# Patient Record
Sex: Female | Born: 1985 | Hispanic: Yes | Marital: Single | Smoking: Never smoker
Health system: Southern US, Community
[De-identification: ages and names within clinical notes are randomized; demographics above are authoritative.]

## PROBLEM LIST (undated history)

## (undated) ENCOUNTER — Inpatient Hospital Stay (HOSPITAL_COMMUNITY): Payer: Self-pay

## (undated) ENCOUNTER — Inpatient Hospital Stay (HOSPITAL_COMMUNITY): Payer: Medicaid Other

## (undated) DIAGNOSIS — D649 Anemia, unspecified: Secondary | ICD-10-CM

## (undated) DIAGNOSIS — R519 Headache, unspecified: Secondary | ICD-10-CM

## (undated) DIAGNOSIS — R51 Headache: Secondary | ICD-10-CM

## (undated) HISTORY — DX: Anemia, unspecified: D64.9

## (undated) HISTORY — PX: APPENDECTOMY: SHX54

---

## 2013-08-26 ENCOUNTER — Encounter: Payer: Self-pay | Admitting: Obstetrics & Gynecology

## 2013-08-26 ENCOUNTER — Ambulatory Visit (INDEPENDENT_AMBULATORY_CARE_PROVIDER_SITE_OTHER): Payer: Self-pay | Admitting: Obstetrics & Gynecology

## 2013-08-26 VITALS — BP 103/68 | HR 70 | Temp 98.3°F | Ht 62.0 in | Wt 122.0 lb

## 2013-08-26 DIAGNOSIS — B373 Candidiasis of vulva and vagina: Secondary | ICD-10-CM

## 2013-08-26 DIAGNOSIS — A499 Bacterial infection, unspecified: Secondary | ICD-10-CM

## 2013-08-26 DIAGNOSIS — B9689 Other specified bacterial agents as the cause of diseases classified elsewhere: Secondary | ICD-10-CM

## 2013-08-26 DIAGNOSIS — B3731 Acute candidiasis of vulva and vagina: Secondary | ICD-10-CM

## 2013-08-26 DIAGNOSIS — N760 Acute vaginitis: Secondary | ICD-10-CM

## 2013-08-26 MED ORDER — FLUCONAZOLE 150 MG PO TABS: 150.0000 mg | ORAL_TABLET | ORAL | Status: DC

## 2013-08-26 MED ORDER — METRONIDAZOLE 500 MG PO TABS: 500.0000 mg | ORAL_TABLET | Freq: Two times a day (BID) | ORAL | Status: DC

## 2013-08-26 NOTE — Progress Notes (Signed)
Subjective:     Kaylee LaddDebbie Kelly is a 28 y.o. female here for a routine exam.  Current complaints: Patient in office today for a problem visit. Patient states that she thinks she has a possible infection. Patient states she has irritation, burning anddryness. Patient states she has a cottage cheese like discharge. Patient states she used nair and when she applied the nair it started burning immediatly and she removed it right away but that it continued to burn for the next two days. Patient states that on the inside she has spots where the skin has peeled. Patient states she did not put any inside the vagina just around the outside but all of the burning is on the inside. Patient denies any itching or odor. Patient states she has a Nexplanon and that it is due to be removed next month. Personal health questionnaire reviewed: yes.   Gynecologic History Patient's last menstrual period was 08/10/2013. Contraception: Nexplanon Last Pap: 2014. Results were: normal  Obstetric History OB History  Gravida Para Term Preterm AB SAB TAB Ectopic Multiple Living  1 1 1       1     # Outcome Date GA Lbr Len/2nd Weight Sex Delivery Anes PTL Lv  1 TRM 04/16/10 [id number]  7 lb (3.175 kg) F LTCS EPI  Y       The following portions of the patient's history were reviewed and updated as appropriate: allergies, current medications, past family history, past medical history, past social history, past surgical history and problem list.  Review of Systems Pertinent items are noted in HPI.    Objective:     Pelvic: EFG w/erythema, white discharge SPEC: thin white discharge     Assessment:   Mixed vulvovaginitis--bacterial vaginosis, candida Atopic dermatitis  Plan:  Avoid irritants Warm soaks Vaseline Orders Placed This Encounter  Procedures  . POCT Wet Prep [facility] [place])  Metronidazole/Diflucan Return prn

## 2013-08-27 ENCOUNTER — Encounter: Payer: Self-pay | Admitting: Obstetrics & Gynecology

## 2013-08-27 LAB — POCT WET PREP (WET MOUNT): CLUE CELLS WET PREP WHIFF POC: POSITIVE

## 2013-08-27 NOTE — Patient Instructions (Signed)
Vaginitis Vaginitis is an inflammation of the vagina. It is most often caused by a change in the normal balance of the bacteria and yeast that live in the vagina. This change in balance causes an overgrowth of certain bacteria or yeast, which causes the inflammation. There are different types of vaginitis, but the most common types are:  Bacterial vaginosis.  Yeast infection (candidiasis).  Trichomoniasis vaginitis. This is a sexually transmitted infection (STI).  Viral vaginitis.  Atropic vaginitis.  Allergic vaginitis. CAUSES  The cause depends on the type of vaginitis. Vaginitis can be caused by:  Bacteria (bacterial vaginosis).  Yeast (yeast infection).  A parasite (trichomoniasis vaginitis)  A virus (viral vaginitis).  Low hormone levels (atrophic vaginitis). Low hormone levels can occur during pregnancy, breastfeeding, or after menopause.  Irritants, such as bubble baths, scented tampons, and feminine sprays (allergic vaginitis). Other factors can change the normal balance of the yeast and bacteria that live in the vagina. These include:  Antibiotic medicines.  Poor hygiene.  Diaphragms, vaginal sponges, spermicides, birth control pills, and intrauterine devices (IUD).  Sexual intercourse.  Infection.  Uncontrolled diabetes.  A weakened immune system. SYMPTOMS  Symptoms can vary depending on the cause of the vaginitis. Common symptoms include:  Abnormal vaginal discharge.  The discharge is white, gray, or yellow with bacterial vaginosis.  The discharge is thick, white, and cheesy with a yeast infection.  The discharge is frothy and yellow or greenish with trichomoniasis.  A bad vaginal odor.  The odor is fishy with bacterial vaginosis.  Vaginal itching, pain, or swelling.  Painful intercourse.  Pain or burning when urinating. Sometimes, there are no symptoms. TREATMENT  Treatment will vary depending on the type of infection.   Bacterial  vaginosis and trichomoniasis are often treated with antibiotic creams or pills.  Yeast infections are often treated with antifungal medicines, such as vaginal creams or suppositories.  Viral vaginitis has no cure, but symptoms can be treated with medicines that relieve discomfort. Your sexual partner should be treated as well.  Atrophic vaginitis may be treated with an estrogen cream, pill, suppository, or vaginal ring. If vaginal dryness occurs, lubricants and moisturizing creams may help. You may be told to avoid scented soaps, sprays, or douches.  Allergic vaginitis treatment involves quitting the use of the product that is causing the problem. Vaginal creams can be used to treat the symptoms. HOME CARE INSTRUCTIONS   Take all medicines as directed by your caregiver.  Keep your genital area clean and dry. Avoid soap and only rinse the area with water.  Avoid douching. It can remove the healthy bacteria in the vagina.  Do not use tampons or have sexual intercourse until your vaginitis has been treated. Use sanitary pads while you have vaginitis.  Wipe from front to back. This avoids the spread of bacteria from the rectum to the vagina.  Let air reach your genital area.  Wear cotton underwear to decrease moisture buildup.  Avoid wearing underwear while you sleep until your vaginitis is gone.  Avoid tight pants and underwear or nylons without a cotton panel.  Take off wet clothing (especially bathing suits) as soon as possible.  Use mild, non-scented products. Avoid using irritants, such as:  Scented feminine sprays.  Fabric softeners.  Scented detergents.  Scented tampons.  Scented soaps or bubble baths.  Practice safe sex and use condoms. Condoms may prevent the spread of trichomoniasis and viral vaginitis. SEEK MEDICAL CARE IF:   You have abdominal pain.  You   have a fever or persistent symptoms for more than 2 3 days.  You have a fever and your symptoms suddenly  get worse. Document Released: 05/04/2007 Document Revised: 03/31/2012 Document Reviewed: 12/18/2011 [employer] Patient Information 2014 [employer].  

## 2014-05-22 ENCOUNTER — Encounter: Payer: Self-pay | Admitting: Obstetrics & Gynecology

## 2014-07-17 ENCOUNTER — Encounter: Payer: Self-pay | Admitting: *Deleted

## 2014-07-18 ENCOUNTER — Encounter: Payer: Self-pay | Admitting: Obstetrics & Gynecology

## 2014-07-21 NOTE — L&D Delivery Note (Signed)
Called by Dr. Leotis ShamesAkintemi to attend this [redacted] week gestation delivery by repeat C-Section for persistent breech presentation after two failed version attempts. Infant was born to a  29 yo, Gr 2 woman.  Infant was vigorous at birth with a hardy cry and good tone.  Apgars were 9 and 9 at 1 and 5 minutes respectively.  Three vessel cord noted.  No obvious abnormalities noted.

## 2014-07-31 ENCOUNTER — Inpatient Hospital Stay (HOSPITAL_COMMUNITY)
Admission: AD | Admit: 2014-07-31 | Discharge: 2014-07-31 | Disposition: A | Discharge status: Home / Self Care | Payer: Medicaid Other | Source: Ambulatory Visit | Attending: Family Medicine | Admitting: Family Medicine

## 2014-07-31 ENCOUNTER — Encounter (HOSPITAL_COMMUNITY): Payer: Self-pay

## 2014-07-31 DIAGNOSIS — B373 Candidiasis of vulva and vagina: Secondary | ICD-10-CM | POA: Diagnosis not present

## 2014-07-31 DIAGNOSIS — O98819 Other maternal infectious and parasitic diseases complicating pregnancy, unspecified trimester: Secondary | ICD-10-CM | POA: Insufficient documentation

## 2014-07-31 DIAGNOSIS — R350 Frequency of micturition: Secondary | ICD-10-CM

## 2014-07-31 DIAGNOSIS — B3731 Acute candidiasis of vulva and vagina: Secondary | ICD-10-CM

## 2014-07-31 DIAGNOSIS — Z3A00 Weeks of gestation of pregnancy not specified: Secondary | ICD-10-CM | POA: Insufficient documentation

## 2014-07-31 DIAGNOSIS — R51 Headache: Secondary | ICD-10-CM | POA: Insufficient documentation

## 2014-07-31 DIAGNOSIS — R109 Unspecified abdominal pain: Secondary | ICD-10-CM | POA: Diagnosis present

## 2014-07-31 LAB — URINALYSIS, ROUTINE W REFLEX MICROSCOPIC
Bilirubin Urine: NEGATIVE
Glucose, UA: NEGATIVE mg/dL
Hgb urine dipstick: NEGATIVE
Ketones, ur: NEGATIVE mg/dL
NITRITE: NEGATIVE
Protein, ur: NEGATIVE mg/dL
Specific Gravity, Urine: 1.02 (ref 1.005–1.030)
Urobilinogen, UA: 0.2 mg/dL (ref 0.0–1.0)
pH: 7.5 (ref 5.0–8.0)

## 2014-07-31 LAB — URINE MICROSCOPIC-ADD ON

## 2014-07-31 LAB — WET PREP, GENITAL
Clue Cells Wet Prep HPF POC: NONE SEEN
Trich, Wet Prep: NONE SEEN

## 2014-07-31 MED ORDER — BUTALBITAL-APAP-CAFFEINE 50-325-40 MG PO TABS
2.0000 | ORAL_TABLET | Freq: Once | ORAL | Status: AC
  Administered 2014-07-31: 2 via ORAL
  Filled 2014-07-31: qty 2

## 2014-07-31 MED ORDER — PRENATAL VITAMINS 0.8 MG PO TABS: 1.0000 | ORAL_TABLET | Freq: Every day | ORAL | Status: DC

## 2014-07-31 MED ORDER — FLUCONAZOLE 150 MG PO TABS: 150.0000 mg | ORAL_TABLET | Freq: Once | ORAL | Status: DC

## 2014-07-31 NOTE — MAU Provider Note (Signed)
History   CSN: [id number]  Arrival date and time: 07/31/14 1156   First Provider Initiated Contact with Patient 07/31/14 1356      Chief Complaint  Patient presents with  . Abdominal Pain  . Urinary Tract Infection   The history is provided by the patient.    Kaylee Kelly is a 29 y.o. female G2P1001 presenting to MAU complaining of abdominal pain and daily headaches. Kaylee Kelly has some difficulty providing her history, but estimates that she has been having headaches for 3 weeks, rating pain at 9/10 and states they've been waking her up from sleep. Vaguely localizes the headache to the back of the head but is unable to further characterize.  3 weeks ago she developed sharp, stabbing suprapubic pain post voiding, with urinary frequency, urgency and 2 episodes of  incontinence. Also endorses mod low back pain. Denies any fever, chills, changes in vision, malaise, chest pain, diarrhea or constipation. Tried ibuprofen for HE last week without much relief. Had Nexplanon removed 6 months ago and has not had menses since. 4 days ago confirmed pregnancy with at home UPT. Unsure the dating of pregnancy. Previous pregnancy was uneventful.    OB History    Gravida Para Term Preterm AB TAB SAB Ectopic Multiple Living   Past Medical History  Diagnosis Date  . Anemia     Past Surgical History  Procedure Laterality Date  . Cesarean section    . Appendectomy      Family History  Problem Relation Age of Onset  . Hyperlipidemia Father     History  Substance Use Topics  . Smoking status: Never Smoker   . Smokeless tobacco: Never Used  . Alcohol Use: No     Comment: socially     Allergies: No Known Allergies  Prescriptions prior to admission  Medication Sig Dispense Refill Last Dose  . ibuprofen (ADVIL,MOTRIN) 200 MG tablet Take 200 mg by mouth daily as needed for mild pain or moderate pain.   Past Week at Unknown time  . fluconazole (DIFLUCAN) 150 MG tablet  Take 1 tablet (150 mg total) by mouth every other day. For one week (Patient not taking: Reported on 07/31/2014) 3 tablet 1   . metroNIDAZOLE (FLAGYL) 500 MG tablet Take 1 tablet (500 mg total) by mouth 2 (two) times daily. (Patient not taking: Reported on 07/31/2014) 14 tablet 0     Review of Systems  Constitutional: Negative for fever, chills and malaise/fatigue.  Eyes: Negative for blurred vision and double vision.  Cardiovascular: Negative for chest pain.  Gastrointestinal: Positive for nausea (controlled with lavender scent) and abdominal pain (suprapubic). Negative for heartburn, vomiting, diarrhea, constipation and blood in stool.  Genitourinary: Positive for dysuria (sharp stabbing pain post voiding), urgency and frequency. Negative for hematuria and flank pain.       2 episodes of urinary incontinence Denies vaginal bleeding.  Endorses white vaginal discharge  Neurological: Positive for headaches (daily, wakes pt from sleep at night, 9/10).   Physical Exam   Blood pressure 105/59, pulse 70, temperature 97.8 F (36.6 C), temperature source Oral, resp. rate 16, height 5' 4.4" (1.636 Kelly), weight 123 lb (55.792 kg).  Physical Exam  Vitals reviewed. Constitutional: She is oriented to person, place, and time. She appears well-developed and well-nourished. No distress.  HENT:  Head: Normocephalic and atraumatic.  Neck: Normal range of motion.  Cardiovascular: Normal rate and intact  distal pulses.   No peripheral edema.   Respiratory: Effort normal.  GI: Soft. Bowel sounds are normal. She exhibits no distension and no mass. There is tenderness (LLQ and suprapubic ). There is guarding. There is no rebound and no CVA tenderness.  Genitourinary: Pelvic exam was performed with patient supine. There is no rash, tenderness, lesion or injury on the right labia. There is no rash, tenderness, lesion or injury on the left labia. Uterus is enlarged (fundus est. 2 cm below umbilicus). Uterus is not  tender. Cervix exhibits no motion tenderness, no discharge and no friability. Right adnexum displays no mass, no tenderness and no fullness. Left adnexum displays tenderness. Left adnexum displays no mass and no fullness. There is erythema and tenderness in the vagina. No bleeding in the vagina. No foreign body around the vagina. No signs of injury around the vagina. Vaginal discharge (thick white clumpy) found.  Neurological: She is alert and oriented to person, place, and time.  Skin: Skin is warm and dry.    MAU Course  Procedures  MDM Fetal tones + on doppler.  UA +leukocytes, - nitrites  Marked vaginal erythema, tenerness and clumpy white discharge on vaginal exam.   Results for orders placed or performed during the hospital encounter of 07/31/14 (from the past 24 hour(s))  Urinalysis, Routine w reflex microscopic     Status: Abnormal   Collection Time: 07/31/14 12:45 PM  Result Value Ref Range   Color, Urine YELLOW YELLOW   APPearance CLEAR CLEAR   Specific Gravity, Urine 1.020 1.005 - 1.030   pH 7.5 5.0 - 8.0   Glucose, UA NEGATIVE NEGATIVE mg/dL   Hgb urine dipstick NEGATIVE NEGATIVE   Bilirubin Urine NEGATIVE NEGATIVE   Ketones, ur NEGATIVE NEGATIVE mg/dL   Protein, ur NEGATIVE NEGATIVE mg/dL   Urobilinogen, UA 0.2 0.0 - 1.0 mg/dL   Nitrite NEGATIVE NEGATIVE   Leukocytes, UA MODERATE (A) NEGATIVE  Urine microscopic-add on     Status: Abnormal   Collection Time: 07/31/14 12:45 PM  Result Value Ref Range   Squamous Epithelial / LPF MANY (A) RARE   WBC, UA 7-10 <3 WBC/hpf   RBC / HPF 3-6 <3 RBC/hpf   Bacteria, UA MANY (A) RARE  Wet prep, genital     Status: Abnormal   Collection Time: 07/31/14  2:12 PM  Result Value Ref Range   Yeast Wet Prep HPF POC FEW (A) NONE SEEN   Trich, Wet Prep NONE SEEN NONE SEEN   Clue Cells Wet Prep HPF POC NONE SEEN NONE SEEN   WBC, Wet Prep HPF POC FEW (A) NONE SEEN     Assessment and Plan  1. Vaginal candidiasis.  Prescribed  fluconazole PO per patient preference. Patient to f/u with OB once care established.   2. Tension headaches. Provided Fioricet here.  Advised on stress reduction and appropriate hydration. Discussed appropriate pain management options during pregnancy. Patient to f/u with her OB once established.   2. Urinary frequency, pending UA cultures. Will contact patient if culture results + with appropriate treatment.   3. Pregnancy, est. 16-18 weeks based on uterine size. Patient counseled on establishing prenatal care, and maintaining proper diet and supplementation. Provided with contact information for OB providers in the area.  Patient counseled to return to MAU should any of teh symptoms worsen and do not resolve after treatment.    Misior, Kaylee Kelly 07/31/2014 3:47 PM  Seen and examined by me also Agree with note Aviva SignsMarie L Javiel Kelly, CNM

## 2014-07-31 NOTE — MAU Note (Signed)
FHT's dopplered

## 2014-07-31 NOTE — Discharge Instructions (Signed)
Candidal Vulvovaginitis Candidal vulvovaginitis is an infection of the vagina and vulva. The vulva is the skin around the opening of the vagina. This may cause itching and discomfort in and around the vagina.  HOME CARE  Only take medicine as told by your doctor.  Do not have sex (intercourse) until the infection is healed or as told by your doctor.  Practice safe sex.  Tell your sex partner about your infection.  Do not douche or use tampons.  Wear cotton underwear. Do not wear tight pants or panty hose.  Eat yogurt. This may help treat and prevent yeast infections. GET HELP RIGHT AWAY IF:   You have a fever.  Your problems get worse during treatment or do not get better in 3 days.  You have discomfort, irritation, or itching in your vagina or vulva area.  You have pain after sex.  You start to get belly (abdominal) pain. MAKE SURE YOU:  Understand these instructions.  Will watch your condition.  Will get help right away if you are not doing well or get worse. Document Released: 10/03/2008 Document Revised: 07/12/2013 Document Reviewed: 10/03/2008 [facility] Patient Information 2015 [place], [place]. This information is not intended to replace advice given to you by your health care provider. Make sure you discuss any questions you have with your health care provider.   Second Trimester of Pregnancy The second trimester is from week 13 through week 28, months 4 through 6. The second trimester is often a time when you feel your best. Your body has also adjusted to being pregnant, and you begin to feel better physically. Usually, morning sickness has lessened or quit completely, you may have more energy, and you may have an increase in appetite. The second trimester is also a time when the fetus is growing rapidly. At the end of the sixth month, the fetus is about 9 inches long and weighs about 1 pounds. You will likely begin to feel the baby move (quickening) between 18 and 20  weeks of the pregnancy. BODY CHANGES Your body goes through many changes during pregnancy. The changes vary from woman to woman.   Your weight will continue to increase. You will notice your lower abdomen bulging out.  You may begin to get stretch marks on your hips, abdomen, and breasts.  You may develop headaches that can be relieved by medicines approved by your health care provider.  You may urinate more often because the fetus is pressing on your bladder.  You may develop or continue to have heartburn as a result of your pregnancy.  You may develop constipation because certain hormones are causing the muscles that push waste through your intestines to slow down.  You may develop hemorrhoids or swollen, bulging veins (varicose veins).  You may have back pain because of the weight gain and pregnancy hormones relaxing your joints between the bones in your pelvis and as a result of a shift in weight and the muscles that support your balance.  Your breasts will continue to grow and be tender.  Your gums may bleed and may be sensitive to brushing and flossing.  Dark spots or blotches (chloasma, mask of pregnancy) may develop on your face. This will likely fade after the baby is born.  A dark line from your belly button to the pubic area (linea nigra) may appear. This will likely fade after the baby is born.  You may have changes in your hair. These can include thickening of your hair, rapid growth,  and changes in texture. Some women also have hair loss during or after pregnancy, or hair that feels dry or thin. Your hair will most likely return to normal after your baby is born. WHAT TO EXPECT AT YOUR PRENATAL VISITS During a routine prenatal visit:  You will be weighed to make sure you and the fetus are growing normally.  Your blood pressure will be taken.  Your abdomen will be measured to track your baby's growth.  The fetal heartbeat will be listened to.  Any test results  from the previous visit will be discussed. Your health care provider may ask you:  How you are feeling.  If you are feeling the baby move.  If you have had any abnormal symptoms, such as leaking fluid, bleeding, severe headaches, or abdominal cramping.  If you have any questions. Other tests that may be performed during your second trimester include:  Blood tests that check for:  Low iron levels (anemia).  Gestational diabetes (between 24 and 28 weeks).  Rh antibodies.  Urine tests to check for infections, diabetes, or protein in the urine.  An ultrasound to confirm the proper growth and development of the baby.  An amniocentesis to check for possible genetic problems.  Fetal screens for spina bifida and Down syndrome. HOME CARE INSTRUCTIONS   Avoid all smoking, herbs, alcohol, and unprescribed drugs. These chemicals affect the formation and growth of the baby.  Follow your health care provider's instructions regarding medicine use. There are medicines that are either safe or unsafe to take during pregnancy.  Exercise only as directed by your health care provider. Experiencing uterine cramps is a good sign to stop exercising.  Continue to eat regular, healthy meals.  Wear a good support bra for breast tenderness.  Do not use hot tubs, steam rooms, or saunas.  Wear your seat belt at all times when driving.  Avoid raw meat, uncooked cheese, cat litter boxes, and soil used by cats. These carry germs that can cause birth defects in the baby.  Take your prenatal vitamins.  Try taking a stool softener (if your health care provider approves) if you develop constipation. Eat more high-fiber foods, such as fresh vegetables or fruit and whole grains. Drink plenty of fluids to keep your urine clear or pale yellow.  Take warm sitz baths to soothe any pain or discomfort caused by hemorrhoids. Use hemorrhoid cream if your health care provider approves.  If you develop varicose  veins, wear support hose. Elevate your feet for 15 minutes, 3-4 times a day. Limit salt in your diet.  Avoid heavy lifting, wear low heel shoes, and practice good posture.  Rest with your legs elevated if you have leg cramps or low back pain.  Visit your dentist if you have not gone yet during your pregnancy. Use a soft toothbrush to brush your teeth and be gentle when you floss.  A sexual relationship may be continued unless your health care provider directs you otherwise.  Continue to go to all your prenatal visits as directed by your health care provider. SEEK MEDICAL CARE IF:   You have dizziness.  You have mild pelvic cramps, pelvic pressure, or nagging pain in the abdominal area.  You have persistent nausea, vomiting, or diarrhea.  You have a bad smelling vaginal discharge.  You have pain with urination. SEEK IMMEDIATE MEDICAL CARE IF:   You have a fever.  You are leaking fluid from your vagina.  You have spotting or bleeding from your vagina.  You have severe abdominal cramping or pain.  You have rapid weight gain or loss.  You have shortness of breath with chest pain.  You notice sudden or extreme swelling of your face, hands, ankles, feet, or legs.  You have not felt your baby move in over an hour.  You have severe headaches that do not go away with medicine.  You have vision changes. Document Released: 07/01/2001 Document Revised: 07/12/2013 Document Reviewed: 09/07/2012 [facility] Patient Information 2015 [place], [place]. This information is not intended to replace advice given to you by your health care provider. Make sure you discuss any questions you have with your health care provider.  Prenatal Care [facility]    [facility]  [phone]     Phone: [phone]          [facility]                      [facility]   [place]     Phone: [phone]  Phone:  [phone]         [facility]     Phone: [phone]  Phone: [phone]           [facility] [place]                hone: [phone]  Phone: [phone]         [facility] Dr. Francoise Ceo      Phone: [phone]  Phone: [phone]         [facility]                Phone: [phone]  [facility]   [address] [place])          Phone: [phone] [facility]   Phone: [phone]

## 2014-07-31 NOTE — MAU Note (Signed)
Hx irregualr cycles. Had implanon taken out in March. No menses for past six months. Unsure when she got pregnant. Has burning and pain during and post voiding for past 3 weeks. Also intermittently feels drained/faint. Unsure if blood sugar is the problem or her anemia. Denies abnormal vaginal discharge.

## 2014-08-01 LAB — CULTURE, OB URINE
Colony Count: 70000
Special Requests: NORMAL

## 2014-08-01 LAB — GC/CHLAMYDIA PROBE AMP
CT PROBE, AMP APTIMA: NEGATIVE
GC Probe RNA: NEGATIVE

## 2014-08-25 ENCOUNTER — Encounter (HOSPITAL_COMMUNITY): Payer: Self-pay | Admitting: Advanced Practice Midwife

## 2014-08-25 ENCOUNTER — Inpatient Hospital Stay (HOSPITAL_COMMUNITY)
Admission: AD | Admit: 2014-08-25 | Discharge: 2014-08-25 | Disposition: A | Discharge status: Home / Self Care | Payer: Medicaid Other | Source: Ambulatory Visit | Attending: Obstetrics & Gynecology | Admitting: Obstetrics & Gynecology

## 2014-08-25 DIAGNOSIS — O3421 Maternal care for scar from previous cesarean delivery: Secondary | ICD-10-CM | POA: Diagnosis not present

## 2014-08-25 DIAGNOSIS — N898 Other specified noninflammatory disorders of vagina: Secondary | ICD-10-CM | POA: Diagnosis present

## 2014-08-25 DIAGNOSIS — O0932 Supervision of pregnancy with insufficient antenatal care, second trimester: Secondary | ICD-10-CM

## 2014-08-25 DIAGNOSIS — O98812 Other maternal infectious and parasitic diseases complicating pregnancy, second trimester: Secondary | ICD-10-CM | POA: Insufficient documentation

## 2014-08-25 DIAGNOSIS — B3731 Acute candidiasis of vulva and vagina: Secondary | ICD-10-CM | POA: Diagnosis present

## 2014-08-25 DIAGNOSIS — Z3A18 18 weeks gestation of pregnancy: Secondary | ICD-10-CM | POA: Diagnosis not present

## 2014-08-25 DIAGNOSIS — Z98891 History of uterine scar from previous surgery: Secondary | ICD-10-CM

## 2014-08-25 DIAGNOSIS — B373 Candidiasis of vulva and vagina: Secondary | ICD-10-CM | POA: Insufficient documentation

## 2014-08-25 HISTORY — DX: Headache: R51

## 2014-08-25 HISTORY — DX: Headache, unspecified: R51.9

## 2014-08-25 LAB — URINALYSIS, ROUTINE W REFLEX MICROSCOPIC
BILIRUBIN URINE: NEGATIVE
GLUCOSE, UA: NEGATIVE mg/dL
Hgb urine dipstick: NEGATIVE
Ketones, ur: NEGATIVE mg/dL
Nitrite: NEGATIVE
PROTEIN: NEGATIVE mg/dL
SPECIFIC GRAVITY, URINE: 1.015 (ref 1.005–1.030)
UROBILINOGEN UA: 0.2 mg/dL (ref 0.0–1.0)
pH: 7.5 (ref 5.0–8.0)

## 2014-08-25 LAB — WET PREP, GENITAL
Clue Cells Wet Prep HPF POC: NONE SEEN
TRICH WET PREP: NONE SEEN

## 2014-08-25 LAB — URINE MICROSCOPIC-ADD ON

## 2014-08-25 MED ORDER — FLUCONAZOLE 150 MG PO TABS: ORAL_TABLET | ORAL | Status: DC

## 2014-08-25 NOTE — MAU Note (Signed)
Recent yeast infection, treated with Diflucan- helped but very brief time before symptoms came back, now having swelling and itching.

## 2014-08-25 NOTE — MAU Provider Note (Signed)
Chief Complaint: Vaginal Discharge   First Provider Initiated Contact with Patient 08/25/14 1203     SUBJECTIVE HPI: Kaylee Kelly is a 29 y.o. G2P1001 at 18-20 wks who presents with persistent vulvovaginal itching and thick white discharge. Seen 07/31/2014 with yeast on wet prep and negative GC/CT. Took Diflucan then with slight relief temporarily. Also tried OTC creams with no relief. Nexplanon removed in March 2015 and she has been amenorrheic since. Denies dysuria, frequency, urgency.   Pregnancy course: No prenatal care. [facility] Denies abdominal pain or bleeding.  OB history: Primary C-section in [place] for fetal heart rate abnormality.  Past Medical History  Diagnosis Date  . Anemia   . Headache    OB History  Gravida Para Term Preterm AB SAB TAB Ectopic Multiple Living  2 1 1       1     # Outcome Date GA Lbr Len/2nd Weight Sex Delivery Anes PTL Lv  2 Current           1 Term 04/16/10 4536w0d  3.175 kg (7 lb) F CS-LTranv EPI  Y     Past Surgical History  Procedure Laterality Date  . Cesarean section    . Appendectomy     History   Social History  . Marital Status: Married    Spouse Name: N/A    Number of Children: N/A  . Years of Education: N/A   Occupational History  . Not on file.   Social History Main Topics  . Smoking status: Never Smoker   . Smokeless tobacco: Never Used  . Alcohol Use: Yes     Comment: socially; none now with preg   . Drug Use: No  . Sexual Activity:    Partners: Male    Birth Control/ Protection: None   Other Topics Concern  . Not on file   Social History Narrative   No current facility-administered medications on file prior to encounter.   Current Outpatient Prescriptions on File Prior to Encounter  Medication Sig Dispense Refill  . fluconazole (DIFLUCAN) 150 MG tablet Take 1 tablet (150 mg total) by mouth once. 1 tablet 1  . ibuprofen (ADVIL,MOTRIN) 200 MG tablet Take 200 mg by mouth daily as needed for mild pain or  moderate pain.    . Prenatal Multivit-Min-Fe-FA (PRENATAL VITAMINS) 0.8 MG tablet Take 1 tablet by mouth daily. 30 tablet 12   No Known Allergies  ROS: Pertinent items in HPI  OBJECTIVE Blood pressure 107/52, pulse 78, temperature 98.5 F (36.9 C), temperature source Oral, resp. rate 16, height 5\' 4"  (1.626 m), weight 57.425 kg (126 lb 9.6 oz). GENERAL: Well-developed, well-nourished female in no acute distress.  HEENT: Normocephalic HEART: normal rate RESP: normal effort ABDOMEN: Soft, non-tender. Fundus at U-432fb. DT FHR 153 EXTREMITIES: Nontender, no edema NEURO: Alert and oriented SPECULUM EXAM: Vulvar erythema, no lesions. Copious, curdy white discharge discharge, no blood noted, cervix clean BIMANUAL: cervix Long closed; uterus normal size, no adnexal tenderness or masses  LAB RESULTS Results for orders placed or performed during the hospital encounter of 08/25/14 (from the past 24 hour(s))  Urinalysis, Routine w reflex microscopic     Status: Abnormal   Collection Time: 08/25/14 10:20 AM  Result Value Ref Range   Color, Urine YELLOW YELLOW   APPearance CLEAR CLEAR   Specific Gravity, Urine 1.015 1.005 - 1.030   pH 7.5 5.0 - 8.0   Glucose, UA NEGATIVE NEGATIVE mg/dL   Hgb urine dipstick NEGATIVE NEGATIVE  Bilirubin Urine NEGATIVE NEGATIVE   Ketones, ur NEGATIVE NEGATIVE mg/dL   Protein, ur NEGATIVE NEGATIVE mg/dL   Urobilinogen, UA 0.2 0.0 - 1.0 mg/dL   Nitrite NEGATIVE NEGATIVE   Leukocytes, UA LARGE (A) NEGATIVE  Urine microscopic-add on     Status: Abnormal   Collection Time: 08/25/14 10:20 AM  Result Value Ref Range   Squamous Epithelial / LPF FEW (A) RARE   WBC, UA 3-6 <3 WBC/hpf   RBC / HPF 7-10 <3 RBC/hpf   Bacteria, UA FEW (A) RARE   Urine-Other FEW YEAST   Wet prep, genital     Status: Abnormal   Collection Time: 08/25/14 12:53 PM  Result Value Ref Range   Yeast Wet Prep HPF POC MANY (A) NONE SEEN   Trich, Wet Prep NONE SEEN NONE SEEN   Clue Cells Wet  Prep HPF POC NONE SEEN NONE SEEN   WBC, Wet Prep HPF POC MODERATE (A) NONE SEEN    IMAGING No results found.  MAU COURSE  ASSESSMENT 1. Yeast infection involving the vagina and surrounding area   2. Insufficient prenatal care in second trimester   3. History of cesarean section   G2P1001 at 18-20 wks  PLAN Discharge home    Medication List    TAKE these medications        fluconazole 150 MG tablet  Commonly known as:  DIFLUCAN  Take 1 tab now. Repeat in 2 days if needed.     Prenatal Vitamins 0.8 MG tablet  Take 1 tablet by mouth daily.       Follow-up Information    Follow up with [facility].   Specialty:  Obstetrics and Gynecology   Why:  Someone from Clinic will call you with appt.   Contact information:   [address] [place] [address] [phone]    Outpatient ultrasound for pregnancy dating and anatomic screen scheduled.  Danae Orleans, CNM 08/25/2014  12:05 PM

## 2014-08-25 NOTE — MAU Note (Signed)
White discharge for the last month, was dx'd with yeast infection & prescribed  Diflucan, sx's have continues.  Also reports vaginal swelling & itching.  No PNC.

## 2014-08-25 NOTE — Discharge Instructions (Signed)
Candidal Vulvovaginitis Candidal vulvovaginitis is an infection of the vagina and vulva. The vulva is the skin around the opening of the vagina. This may cause itching and discomfort in and around the vagina.  HOME CARE  Only take medicine as told by your doctor.  Do not have sex (intercourse) until the infection is healed or as told by your doctor.  Practice safe sex.  Tell your sex partner about your infection.  Do not douche or use tampons.  Wear cotton underwear. Do not wear tight pants or panty hose.  Eat yogurt. This may help treat and prevent yeast infections. GET HELP RIGHT AWAY IF:   You have a fever.  Your problems get worse during treatment or do not get better in 3 days.  You have discomfort, irritation, or itching in your vagina or vulva area.  You have pain after sex.  You start to get belly (abdominal) pain. MAKE SURE YOU:  Understand these instructions.  Will watch your condition.  Will get help right away if you are not doing well or get worse. Document Released: 10/03/2008 Document Revised: 07/12/2013 Document Reviewed: 10/03/2008 [facility] Patient Information 2015 [employer], [place]. This information is not intended to replace advice given to you by your health care provider. Make sure you discuss any questions you have with your health care provider.  [facility]    [facility]  [phone]- [phone]     Phone: [phone]          [facility]                      [facility] [place]  @[place]     Phone: [phone]  Phone: [phone]         [facility]     Phone: [phone]  Phone: [phone]           [facility] [place]                hone: [phone]  Phone: [phone]         [facility] [address] Ce[facility]      Phone: [phone]  Phone: [phone]         [facility]                Phone: [phone]  [facility]   [phone]    [facility] [address] [place]          Phone: [phone] [facility]   Phone: [phone] The patient has been in otherwise good general health in the past.

## 2014-09-01 ENCOUNTER — Encounter: Payer: Self-pay | Admitting: Advanced Practice Midwife

## 2014-09-01 ENCOUNTER — Encounter: Payer: Self-pay | Admitting: Obstetrics and Gynecology

## 2014-09-15 ENCOUNTER — Encounter: Payer: Self-pay | Admitting: General Practice

## 2014-10-11 ENCOUNTER — Encounter: Payer: Self-pay | Admitting: Advanced Practice Midwife

## 2014-10-11 ENCOUNTER — Ambulatory Visit (INDEPENDENT_AMBULATORY_CARE_PROVIDER_SITE_OTHER): Payer: Medicaid Other | Admitting: Advanced Practice Midwife

## 2014-10-11 ENCOUNTER — Other Ambulatory Visit (HOSPITAL_COMMUNITY)
Admission: RE | Admit: 2014-10-11 | Discharge: 2014-10-11 | Disposition: A | Discharge status: Home / Self Care | Payer: Medicaid Other | Source: Ambulatory Visit | Attending: Advanced Practice Midwife | Admitting: Advanced Practice Midwife

## 2014-10-11 VITALS — BP 110/64 | HR 97 | Wt 138.1 lb

## 2014-10-11 DIAGNOSIS — Z113 Encounter for screening for infections with a predominantly sexual mode of transmission: Secondary | ICD-10-CM | POA: Diagnosis present

## 2014-10-11 DIAGNOSIS — Z118 Encounter for screening for other infectious and parasitic diseases: Secondary | ICD-10-CM

## 2014-10-11 DIAGNOSIS — Z01419 Encounter for gynecological examination (general) (routine) without abnormal findings: Secondary | ICD-10-CM | POA: Diagnosis not present

## 2014-10-11 DIAGNOSIS — O0932 Supervision of pregnancy with insufficient antenatal care, second trimester: Secondary | ICD-10-CM

## 2014-10-11 DIAGNOSIS — O0930 Supervision of pregnancy with insufficient antenatal care, unspecified trimester: Secondary | ICD-10-CM | POA: Insufficient documentation

## 2014-10-11 DIAGNOSIS — Z124 Encounter for screening for malignant neoplasm of cervix: Secondary | ICD-10-CM

## 2014-10-11 DIAGNOSIS — Z98891 History of uterine scar from previous surgery: Secondary | ICD-10-CM

## 2014-10-11 LAB — POCT URINALYSIS DIP (DEVICE)
Bilirubin Urine: NEGATIVE
GLUCOSE, UA: NEGATIVE mg/dL
Hgb urine dipstick: NEGATIVE
Ketones, ur: NEGATIVE mg/dL
Nitrite: NEGATIVE
Protein, ur: NEGATIVE mg/dL
SPECIFIC GRAVITY, URINE: 1.015 (ref 1.005–1.030)
Urobilinogen, UA: 0.2 mg/dL (ref 0.0–1.0)
pH: 7 (ref 5.0–8.0)

## 2014-10-11 MED ORDER — TETANUS-DIPHTH-ACELL PERTUSSIS 5-2.5-18.5 LF-MCG/0.5 IM SUSP: 0.5000 mL | Freq: Once | INTRAMUSCULAR | Status: DC

## 2014-10-11 NOTE — Progress Notes (Signed)
Pt had +fht in mau

## 2014-10-11 NOTE — Progress Notes (Signed)
   Subjective:    Ileana LaddDebbie Yerger is a G2P1001 5625w2d being seen today for her first obstetrical visit.  Her obstetrical history is significant for previous LTCS at term. Patient does intend to breast feed. Pregnancy history fully reviewed.  Patient reports nausea.  Filed Vitals:   10/11/14 0952  BP: 110/64  Pulse: 97  Weight: 138 lb 1.6 oz (62.642 kg)    HISTORY: OB History  Gravida Para Term Preterm AB SAB TAB Ectopic Multiple Living  2 1 1       1     # Outcome Date GA Lbr Len/2nd Weight Sex Delivery Anes PTL Lv  2 Current           1 Term 04/16/10 3061w0d  7 lb (3.175 kg) F CS-LTranv EPI  Y     Past Medical History  Diagnosis Date  . Anemia   . Headache    Past Surgical History  Procedure Laterality Date  . Cesarean section    . Appendectomy     Family History  Problem Relation Age of Onset  . Hyperlipidemia Father   . Diabetes Maternal Grandmother      Exam    Uterus:  Fundal Height: 25 cm  Pelvic Exam:    Perineum: No Hemorrhoids, Normal Perineum   Vulva: normal   Vagina:  normal mucosa, moderate amount thin white discharge   pH:    Cervix: no bleeding following Pap and no cervical motion tenderness   Adnexa: normal adnexa and no mass, fullness, tenderness   Bony Pelvis: average  System: Breast:  deferred   Skin: normal coloration and turgor, no rashes    Neurologic: oriented, normal, gait normal; reflexes normal and symmetric   Extremities: normal strength, tone, and muscle mass   HEENT neck supple with midline trachea and thyroid without masses   Mouth/Teeth mucous membranes moist, pharynx normal without lesions and dental hygiene poor   Neck supple and no masses   Cardiovascular: regular rate and rhythm   Respiratory:  appears well, vitals normal, no respiratory distress, acyanotic, normal RR, ear and throat exam is normal, neck free of mass or lymphadenopathy   Abdomen: soft, non-tender; bowel sounds normal; no masses,  no organomegaly   Urinary:  urethral meatus normal      Assessment:    Pregnancy: G2P1001 Patient Active Problem List   Diagnosis Date Noted  . Late prenatal care affecting pregnancy 10/11/2014  . History of cesarean section 08/25/2014  . Insufficient prenatal care in second trimester 08/25/2014  . Yeast infection involving the vagina and surrounding area 08/25/2014        Plan:     Initial labs NOT drawn. Prenatal vitamins. Repeat 1 hour glucose screen and initial labs at next visit--pt vomited today after glucose drink. Problem list reviewed and updated. Genetic Screening discussed : late to care.  Ultrasound discussed; fetal survey: ordered.  Follow up in 2 weeks. 50% of 30 min visit spent on counseling and coordination of care.     LEFTWICH-KIRBY, Maccoy Haubner 10/11/2014

## 2014-10-12 LAB — PRESCRIPTION MONITORING PROFILE (19 PANEL)
Amphetamine/Meth: NEGATIVE ng/mL
BENZODIAZEPINE SCREEN, URINE: NEGATIVE ng/mL
BUPRENORPHINE, URINE: NEGATIVE ng/mL
Barbiturate Screen, Urine: NEGATIVE ng/mL
Cannabinoid Scrn, Ur: NEGATIVE ng/mL
Carisoprodol, Urine: NEGATIVE ng/mL
Cocaine Metabolites: NEGATIVE ng/mL
Creatinine, Urine: 40.03 mg/dL (ref >20.0)
ECSTASY: NEGATIVE ng/mL
Fentanyl, Ur: NEGATIVE ng/mL
METHAQUALONE SCREEN (URINE): NEGATIVE ng/mL
Meperidine, Ur: NEGATIVE ng/mL
Methadone Screen, Urine: NEGATIVE ng/mL
Nitrites, Initial: NEGATIVE ug/mL
Opiate Screen, Urine: NEGATIVE ng/mL
Oxycodone Screen, Ur: NEGATIVE ng/mL
Phencyclidine, Ur: NEGATIVE ng/mL
Propoxyphene: NEGATIVE ng/mL
TAPENTADOLUR: NEGATIVE ng/mL
Tramadol Scrn, Ur: NEGATIVE ng/mL
ZOLPIDEM, URINE: NEGATIVE ng/mL
pH, Initial: 7 pH (ref 4.5–8.9)

## 2014-10-12 LAB — WET PREP, GENITAL: TRICH WET PREP: NONE SEEN

## 2014-10-12 LAB — CULTURE, OB URINE
Colony Count: NO GROWTH
ORGANISM ID, BACTERIA: NO GROWTH

## 2014-10-12 LAB — CYTOLOGY - PAP

## 2014-10-17 ENCOUNTER — Ambulatory Visit (HOSPITAL_COMMUNITY)
Admission: RE | Admit: 2014-10-17 | Discharge: 2014-10-17 | Disposition: A | Discharge status: Home / Self Care | Payer: Medicaid Other | Source: Ambulatory Visit | Attending: Obstetrics and Gynecology | Admitting: Obstetrics and Gynecology

## 2014-10-17 DIAGNOSIS — O0932 Supervision of pregnancy with insufficient antenatal care, second trimester: Secondary | ICD-10-CM | POA: Diagnosis not present

## 2014-10-17 DIAGNOSIS — Z3689 Encounter for other specified antenatal screening: Secondary | ICD-10-CM | POA: Insufficient documentation

## 2014-10-17 DIAGNOSIS — Z36 Encounter for antenatal screening of mother: Secondary | ICD-10-CM | POA: Insufficient documentation

## 2014-10-17 DIAGNOSIS — O3421 Maternal care for scar from previous cesarean delivery: Secondary | ICD-10-CM | POA: Diagnosis not present

## 2014-10-17 DIAGNOSIS — Z3A27 27 weeks gestation of pregnancy: Secondary | ICD-10-CM | POA: Diagnosis not present

## 2014-10-17 DIAGNOSIS — Z98891 History of uterine scar from previous surgery: Secondary | ICD-10-CM

## 2014-10-17 DIAGNOSIS — B3731 Acute candidiasis of vulva and vagina: Secondary | ICD-10-CM

## 2014-10-17 DIAGNOSIS — B373 Candidiasis of vulva and vagina: Secondary | ICD-10-CM

## 2014-10-25 ENCOUNTER — Encounter: Payer: Self-pay | Admitting: Advanced Practice Midwife

## 2014-10-25 ENCOUNTER — Ambulatory Visit (INDEPENDENT_AMBULATORY_CARE_PROVIDER_SITE_OTHER): Payer: Medicaid Other | Admitting: Advanced Practice Midwife

## 2014-10-25 VITALS — BP 111/66 | HR 79 | Wt 143.6 lb

## 2014-10-25 DIAGNOSIS — Z9889 Other specified postprocedural states: Secondary | ICD-10-CM

## 2014-10-25 DIAGNOSIS — Z23 Encounter for immunization: Secondary | ICD-10-CM

## 2014-10-25 DIAGNOSIS — B373 Candidiasis of vulva and vagina: Secondary | ICD-10-CM

## 2014-10-25 DIAGNOSIS — N898 Other specified noninflammatory disorders of vagina: Secondary | ICD-10-CM

## 2014-10-25 DIAGNOSIS — Z3493 Encounter for supervision of normal pregnancy, unspecified, third trimester: Secondary | ICD-10-CM | POA: Diagnosis not present

## 2014-10-25 DIAGNOSIS — Z3483 Encounter for supervision of other normal pregnancy, third trimester: Secondary | ICD-10-CM

## 2014-10-25 DIAGNOSIS — O0932 Supervision of pregnancy with insufficient antenatal care, second trimester: Secondary | ICD-10-CM

## 2014-10-25 DIAGNOSIS — Z98891 History of uterine scar from previous surgery: Secondary | ICD-10-CM

## 2014-10-25 DIAGNOSIS — O98813 Other maternal infectious and parasitic diseases complicating pregnancy, third trimester: Secondary | ICD-10-CM

## 2014-10-25 DIAGNOSIS — O26893 Other specified pregnancy related conditions, third trimester: Secondary | ICD-10-CM | POA: Diagnosis not present

## 2014-10-25 DIAGNOSIS — B3731 Acute candidiasis of vulva and vagina: Secondary | ICD-10-CM

## 2014-10-25 DIAGNOSIS — O0930 Supervision of pregnancy with insufficient antenatal care, unspecified trimester: Secondary | ICD-10-CM

## 2014-10-25 LAB — POCT URINALYSIS DIP (DEVICE)
BILIRUBIN URINE: NEGATIVE
Glucose, UA: NEGATIVE mg/dL
Hgb urine dipstick: NEGATIVE
Ketones, ur: NEGATIVE mg/dL
Nitrite: NEGATIVE
PROTEIN: NEGATIVE mg/dL
SPECIFIC GRAVITY, URINE: 1.01 (ref 1.005–1.030)
Urobilinogen, UA: 0.2 mg/dL (ref 0.0–1.0)
pH: 6 (ref 5.0–8.0)

## 2014-10-25 MED ORDER — TERCONAZOLE 0.4 % VA CREA: 1.0000 | TOPICAL_CREAM | Freq: Every day | VAGINAL | Status: DC

## 2014-10-25 MED ORDER — TETANUS-DIPHTH-ACELL PERTUSSIS 5-2.5-18.5 LF-MCG/0.5 IM SUSP
0.5000 mL | Freq: Once | INTRAMUSCULAR | Status: AC
  Administered 2014-10-25: 0.5 mL via INTRAMUSCULAR

## 2014-10-25 NOTE — Patient Instructions (Signed)

## 2014-10-25 NOTE — Progress Notes (Signed)
Pt reports white vaginal d/c and irritation - thinks it is a yeast infection. She states she was treated recently but it keeps coming back.   1 hr GTT today.  Pt states she is having problems with seasonal allergies and wants to know what she can take.

## 2014-10-25 NOTE — Progress Notes (Signed)
Reviewed Pap, Anatomy scan. Prenatal panel, 1 hour GTT, TDaP today. TOLAC consent. C/O continued yeast infection. Multiple wet preps pos for yeast. Tx w/ Diflucan multiple times w/ only partial/temporary relief. Also Dx/Tx BV. Will switch to Terazol 7. Encouraged to decrease simple carbs keep vaginal area dry. Also doing GTT today. Denies Hx DM.

## 2014-10-25 NOTE — Addendum Note (Signed)
Addended by: Kathee DeltonHILLMAN, CARRIE L on: 10/25/2014 10:33 AM   Modules accepted: Orders

## 2014-10-25 NOTE — Addendum Note (Signed)
Addended by: Sherre LainASH, Shine Scrogham A on: 10/25/2014 02:32 PM   Modules accepted: Orders

## 2014-10-26 LAB — GLUCOSE TOLERANCE, 1 HOUR (50G) W/O FASTING: Glucose, 1 Hour GTT: 92 mg/dL (ref 70–140)

## 2014-10-27 LAB — PRENATAL PROFILE (SOLSTAS)
Antibody Screen: NEGATIVE
BASOS ABS: 0 10*3/uL (ref 0.0–0.1)
Basophils Relative: 0 % (ref 0–1)
Eosinophils Absolute: 0.1 10*3/uL (ref 0.0–0.7)
Eosinophils Relative: 1 % (ref 0–5)
HEMATOCRIT: 31.6 % — AB (ref 36.0–46.0)
HEMOGLOBIN: 10.4 g/dL — AB (ref 12.0–15.0)
HIV 1&2 Ab, 4th Generation: NONREACTIVE
Hepatitis B Surface Ag: NEGATIVE
LYMPHS ABS: 1.3 10*3/uL (ref 0.7–4.0)
Lymphocytes Relative: 12 % (ref 12–46)
MCH: 26.3 pg (ref 26.0–34.0)
MCHC: 32.9 g/dL (ref 30.0–36.0)
MCV: 80 fL (ref 78.0–100.0)
MONO ABS: 0.8 10*3/uL (ref 0.1–1.0)
MONOS PCT: 7 % (ref 3–12)
MPV: 11.7 fL (ref 8.6–12.4)
NEUTROS ABS: 8.9 10*3/uL — AB (ref 1.7–7.7)
Neutrophils Relative %: 80 % — ABNORMAL HIGH (ref 43–77)
Platelets: 178 10*3/uL (ref 150–400)
RBC: 3.95 MIL/uL (ref 3.87–5.11)
RDW: 17 % — AB (ref 11.5–15.5)
RH TYPE: POSITIVE
Rubella: 0.97 Index — ABNORMAL HIGH (ref <0.90)
WBC: 11.1 10*3/uL — AB (ref 4.0–10.5)

## 2014-11-07 ENCOUNTER — Ambulatory Visit (INDEPENDENT_AMBULATORY_CARE_PROVIDER_SITE_OTHER): Payer: Medicaid Other | Admitting: Advanced Practice Midwife

## 2014-11-07 VITALS — BP 99/54 | HR 80 | Temp 97.9°F | Wt 145.8 lb

## 2014-11-07 DIAGNOSIS — O0930 Supervision of pregnancy with insufficient antenatal care, unspecified trimester: Secondary | ICD-10-CM

## 2014-11-07 LAB — POCT URINALYSIS DIP (DEVICE)
Bilirubin Urine: NEGATIVE
GLUCOSE, UA: NEGATIVE mg/dL
Hgb urine dipstick: NEGATIVE
Ketones, ur: NEGATIVE mg/dL
NITRITE: NEGATIVE
Protein, ur: NEGATIVE mg/dL
Specific Gravity, Urine: 1.02 (ref 1.005–1.030)
UROBILINOGEN UA: 0.2 mg/dL (ref 0.0–1.0)
pH: 6 (ref 5.0–8.0)

## 2014-11-07 NOTE — Progress Notes (Signed)
Doing well. Informed glucola normal. Discussed FM and warning signs of PTL

## 2014-11-07 NOTE — Patient Instructions (Signed)
Third Trimester of Pregnancy The third trimester is from week 29 through week 42, months 7 through 9. The third trimester is a time when the fetus is growing rapidly. At the end of the ninth month, the fetus is about 20 inches in length and weighs 6-10 pounds.  BODY CHANGES Your body goes through many changes during pregnancy. The changes vary from woman to woman.   Your weight will continue to increase. You can expect to gain 25-35 pounds (11-16 kg) by the end of the pregnancy.  You may begin to get stretch marks on your hips, abdomen, and breasts.  You may urinate more often because the fetus is moving lower into your pelvis and pressing on your bladder.  You may develop or continue to have heartburn as a result of your pregnancy.  You may develop constipation because certain hormones are causing the muscles that push waste through your intestines to slow down.  You may develop hemorrhoids or swollen, bulging veins (varicose veins).  You may have pelvic pain because of the weight gain and pregnancy hormones relaxing your joints between the bones in your pelvis. Backaches may result from overexertion of the muscles supporting your posture.  You may have changes in your hair. These can include thickening of your hair, rapid growth, and changes in texture. Some women also have hair loss during or after pregnancy, or hair that feels dry or thin. Your hair will most likely return to normal after your baby is born.  Your breasts will continue to grow and be tender. A yellow discharge may leak from your breasts called colostrum.  Your belly button may stick out.  You may feel short of breath because of your expanding uterus.  You may notice the fetus "dropping," or moving lower in your abdomen.  You may have a bloody mucus discharge. This usually occurs a few days to a week before labor begins.  Your cervix becomes thin and soft (effaced) near your due date. WHAT TO EXPECT AT YOUR PRENATAL  EXAMS  You will have prenatal exams every 2 weeks until week 36. Then, you will have weekly prenatal exams. During a routine prenatal visit:  You will be weighed to make sure you and the fetus are growing normally.  Your blood pressure is taken.  Your abdomen will be measured to track your baby's growth.  The fetal heartbeat will be listened to.  Any test results from the previous visit will be discussed.  You may have a cervical check near your due date to see if you have effaced. At around 36 weeks, your caregiver will check your cervix. At the same time, your caregiver will also perform a test on the secretions of the vaginal tissue. This test is to determine if a type of bacteria, Group B streptococcus, is present. Your caregiver will explain this further. Your caregiver may ask you:  What your birth plan is.  How you are feeling.  If you are feeling the baby move.  If you have had any abnormal symptoms, such as leaking fluid, bleeding, severe headaches, or abdominal cramping.  If you have any questions. Other tests or screenings that may be performed during your third trimester include:  Blood tests that check for low iron levels (anemia).  Fetal testing to check the health, activity level, and growth of the fetus. Testing is done if you have certain medical conditions or if there are problems during the pregnancy. FALSE LABOR You may feel small, irregular contractions that   eventually go away. These are called Braxton Hicks contractions, or false labor. Contractions may last for hours, days, or even weeks before true labor sets in. If contractions come at regular intervals, intensify, or become painful, it is best to be seen by your caregiver.  SIGNS OF LABOR   Menstrual-like cramps.  Contractions that are 5 minutes apart or less.  Contractions that start on the top of the uterus and spread down to the lower abdomen and back.  A sense of increased pelvic pressure or back  pain.  A watery or bloody mucus discharge that comes from the vagina. If you have any of these signs before the 37th week of pregnancy, call your caregiver right away. You need to go to the hospital to get checked immediately. HOME CARE INSTRUCTIONS   Avoid all smoking, herbs, alcohol, and unprescribed drugs. These chemicals affect the formation and growth of the baby.  Follow your caregiver's instructions regarding medicine use. There are medicines that are either safe or unsafe to take during pregnancy.  Exercise only as directed by your caregiver. Experiencing uterine cramps is a good sign to stop exercising.  Continue to eat regular, healthy meals.  Wear a good support bra for breast tenderness.  Do not use hot tubs, steam rooms, or saunas.  Wear your seat belt at all times when driving.  Avoid raw meat, uncooked cheese, cat litter boxes, and soil used by cats. These carry germs that can cause birth defects in the baby.  Take your prenatal vitamins.  Try taking a stool softener (if your caregiver approves) if you develop constipation. Eat more high-fiber foods, such as fresh vegetables or fruit and whole grains. Drink plenty of fluids to keep your urine clear or pale yellow.  Take warm sitz baths to soothe any pain or discomfort caused by hemorrhoids. Use hemorrhoid cream if your caregiver approves.  If you develop varicose veins, wear support hose. Elevate your feet for 15 minutes, 3-4 times a day. Limit salt in your diet.  Avoid heavy lifting, wear low heal shoes, and practice good posture.  Rest a lot with your legs elevated if you have leg cramps or low back pain.  Visit your dentist if you have not gone during your pregnancy. Use a soft toothbrush to brush your teeth and be gentle when you floss.  A sexual relationship may be continued unless your caregiver directs you otherwise.  Do not travel far distances unless it is absolutely necessary and only with the approval  of your caregiver.  Take prenatal classes to understand, practice, and ask questions about the labor and delivery.  Make a trial run to the hospital.  Pack your hospital bag.  Prepare the baby's nursery.  Continue to go to all your prenatal visits as directed by your caregiver. SEEK MEDICAL CARE IF:  You are unsure if you are in labor or if your water has broken.  You have dizziness.  You have mild pelvic cramps, pelvic pressure, or nagging pain in your abdominal area.  You have persistent nausea, vomiting, or diarrhea.  You have a bad smelling vaginal discharge.  You have pain with urination. SEEK IMMEDIATE MEDICAL CARE IF:   You have a fever.  You are leaking fluid from your vagina.  You have spotting or bleeding from your vagina.  You have severe abdominal cramping or pain.  You have rapid weight loss or gain.  You have shortness of breath with chest pain.  You notice sudden or extreme swelling   of your face, hands, ankles, feet, or legs.  You have not felt your baby move in over an hour.  You have severe headaches that do not go away with medicine.  You have vision changes. Document Released: 07/01/2001 Document Revised: 07/12/2013 Document Reviewed: 09/07/2012 ExitCare Patient Information 2015 [employer], [employer]. This information is not intended to replace advice given to you by your health care provider. Make sure you discuss any questions you have with your health care provider.  

## 2014-11-07 NOTE — Progress Notes (Signed)
Pt reports having a cramp on the left side of abd

## 2014-11-21 ENCOUNTER — Ambulatory Visit (INDEPENDENT_AMBULATORY_CARE_PROVIDER_SITE_OTHER): Payer: Medicaid Other | Admitting: Obstetrics and Gynecology

## 2014-11-21 ENCOUNTER — Encounter: Payer: Self-pay | Admitting: Obstetrics and Gynecology

## 2014-11-21 VITALS — BP 110/57 | HR 83 | Temp 98.4°F | Wt 150.1 lb

## 2014-11-21 DIAGNOSIS — O0933 Supervision of pregnancy with insufficient antenatal care, third trimester: Secondary | ICD-10-CM

## 2014-11-21 DIAGNOSIS — O26893 Other specified pregnancy related conditions, third trimester: Secondary | ICD-10-CM

## 2014-11-21 DIAGNOSIS — N898 Other specified noninflammatory disorders of vagina: Secondary | ICD-10-CM

## 2014-11-21 DIAGNOSIS — O0930 Supervision of pregnancy with insufficient antenatal care, unspecified trimester: Secondary | ICD-10-CM

## 2014-11-21 LAB — POCT URINALYSIS DIP (DEVICE)
Bilirubin Urine: NEGATIVE
GLUCOSE, UA: NEGATIVE mg/dL
Hgb urine dipstick: NEGATIVE
KETONES UR: NEGATIVE mg/dL
Nitrite: NEGATIVE
Protein, ur: NEGATIVE mg/dL
SPECIFIC GRAVITY, URINE: 1.015 (ref 1.005–1.030)
Urobilinogen, UA: 0.2 mg/dL (ref 0.0–1.0)
pH: 7 (ref 5.0–8.0)

## 2014-11-21 NOTE — Patient Instructions (Signed)
Third Trimester of Pregnancy The third trimester is from week 29 through week 42, months 7 through 9. The third trimester is a time when the fetus is growing rapidly. At the end of the ninth month, the fetus is about 20 inches in length and weighs 6-10 pounds.  BODY CHANGES Your body goes through many changes during pregnancy. The changes vary from woman to woman.   Your weight will continue to increase. You can expect to gain 25-35 pounds (11-16 kg) by the end of the pregnancy.  You may begin to get stretch marks on your hips, abdomen, and breasts.  You may urinate more often because the fetus is moving lower into your pelvis and pressing on your bladder.  You may develop or continue to have heartburn as a result of your pregnancy.  You may develop constipation because certain hormones are causing the muscles that push waste through your intestines to slow down.  You may develop hemorrhoids or swollen, bulging veins (varicose veins).  You may have pelvic pain because of the weight gain and pregnancy hormones relaxing your joints between the bones in your pelvis. Backaches may result from overexertion of the muscles supporting your posture.  You may have changes in your hair. These can include thickening of your hair, rapid growth, and changes in texture. Some women also have hair loss during or after pregnancy, or hair that feels dry or thin. Your hair will most likely return to normal after your baby is born.  Your breasts will continue to grow and be tender. A yellow discharge may leak from your breasts called colostrum.  Your belly button may stick out.  You may feel short of breath because of your expanding uterus.  You may notice the fetus "dropping," or moving lower in your abdomen.  You may have a bloody mucus discharge. This usually occurs a few days to a week before labor begins.  Your cervix becomes thin and soft (effaced) near your due date. WHAT TO EXPECT AT YOUR PRENATAL  EXAMS  You will have prenatal exams every 2 weeks until week 36. Then, you will have weekly prenatal exams. During a routine prenatal visit:  You will be weighed to make sure you and the fetus are growing normally.  Your blood pressure is taken.  Your abdomen will be measured to track your baby's growth.  The fetal heartbeat will be listened to.  Any test results from the previous visit will be discussed.  You may have a cervical check near your due date to see if you have effaced. At around 36 weeks, your caregiver will check your cervix. At the same time, your caregiver will also perform a test on the secretions of the vaginal tissue. This test is to determine if a type of bacteria, Group B streptococcus, is present. Your caregiver will explain this further. Your caregiver may ask you:  What your birth plan is.  How you are feeling.  If you are feeling the baby move.  If you have had any abnormal symptoms, such as leaking fluid, bleeding, severe headaches, or abdominal cramping.  If you have any questions. Other tests or screenings that may be performed during your third trimester include:  Blood tests that check for low iron levels (anemia).  Fetal testing to check the health, activity level, and growth of the fetus. Testing is done if you have certain medical conditions or if there are problems during the pregnancy. FALSE LABOR You may feel small, irregular contractions that   eventually go away. These are called Braxton Hicks contractions, or false labor. Contractions may last for hours, days, or even weeks before true labor sets in. If contractions come at regular intervals, intensify, or become painful, it is best to be seen by your caregiver.  SIGNS OF LABOR   Menstrual-like cramps.  Contractions that are 5 minutes apart or less.  Contractions that start on the top of the uterus and spread down to the lower abdomen and back.  A sense of increased pelvic pressure or back  pain.  A watery or bloody mucus discharge that comes from the vagina. If you have any of these signs before the 37th week of pregnancy, call your caregiver right away. You need to go to the hospital to get checked immediately. HOME CARE INSTRUCTIONS   Avoid all smoking, herbs, alcohol, and unprescribed drugs. These chemicals affect the formation and growth of the baby.  Follow your caregiver's instructions regarding medicine use. There are medicines that are either safe or unsafe to take during pregnancy.  Exercise only as directed by your caregiver. Experiencing uterine cramps is a good sign to stop exercising.  Continue to eat regular, healthy meals.  Wear a good support bra for breast tenderness.  Do not use hot tubs, steam rooms, or saunas.  Wear your seat belt at all times when driving.  Avoid raw meat, uncooked cheese, cat litter boxes, and soil used by cats. These carry germs that can cause birth defects in the baby.  Take your prenatal vitamins.  Try taking a stool softener (if your caregiver approves) if you develop constipation. Eat more high-fiber foods, such as fresh vegetables or fruit and whole grains. Drink plenty of fluids to keep your urine clear or pale yellow.  Take warm sitz baths to soothe any pain or discomfort caused by hemorrhoids. Use hemorrhoid cream if your caregiver approves.  If you develop varicose veins, wear support hose. Elevate your feet for 15 minutes, 3-4 times a day. Limit salt in your diet.  Avoid heavy lifting, wear low heal shoes, and practice good posture.  Rest a lot with your legs elevated if you have leg cramps or low back pain.  Visit your dentist if you have not gone during your pregnancy. Use a soft toothbrush to brush your teeth and be gentle when you floss.  A sexual relationship may be continued unless your caregiver directs you otherwise.  Do not travel far distances unless it is absolutely necessary and only with the approval  of your caregiver.  Take prenatal classes to understand, practice, and ask questions about the labor and delivery.  Make a trial run to the hospital.  Pack your hospital bag.  Prepare the baby's nursery.  Continue to go to all your prenatal visits as directed by your caregiver. SEEK MEDICAL CARE IF:  You are unsure if you are in labor or if your water has broken.  You have dizziness.  You have mild pelvic cramps, pelvic pressure, or nagging pain in your abdominal area.  You have persistent nausea, vomiting, or diarrhea.  You have a bad smelling vaginal discharge.  You have pain with urination. SEEK IMMEDIATE MEDICAL CARE IF:   You have a fever.  You are leaking fluid from your vagina.  You have spotting or bleeding from your vagina.  You have severe abdominal cramping or pain.  You have rapid weight loss or gain.  You have shortness of breath with chest pain.  You notice sudden or extreme swelling   of your face, hands, ankles, feet, or legs.  You have not felt your baby move in over an hour.  You have severe headaches that do not go away with medicine.  You have vision changes. Document Released: 07/01/2001 Document Revised: 07/12/2013 Document Reviewed: 09/07/2012 ExitCare Patient Information 2015 [employer], [employer]. This information is not intended to replace advice given to you by your health care provider. Make sure you discuss any questions you have with your health care provider.  

## 2014-11-21 NOTE — Progress Notes (Signed)
Doing well. Not sure why she had C/S ([facility]) but did not dilate much. Still wants TOLAC. No concerns. FM and preterm labor precautions

## 2014-12-07 ENCOUNTER — Encounter: Payer: Medicaid Other | Admitting: Advanced Practice Midwife

## 2014-12-19 ENCOUNTER — Encounter: Payer: Self-pay | Admitting: Advanced Practice Midwife

## 2014-12-19 ENCOUNTER — Ambulatory Visit (INDEPENDENT_AMBULATORY_CARE_PROVIDER_SITE_OTHER): Payer: Medicaid Other | Admitting: Advanced Practice Midwife

## 2014-12-19 VITALS — BP 123/60 | HR 82 | Temp 98.0°F | Wt 155.1 lb

## 2014-12-19 DIAGNOSIS — O0930 Supervision of pregnancy with insufficient antenatal care, unspecified trimester: Secondary | ICD-10-CM

## 2014-12-19 DIAGNOSIS — O0933 Supervision of pregnancy with insufficient antenatal care, third trimester: Secondary | ICD-10-CM

## 2014-12-19 LAB — POCT URINALYSIS DIP (DEVICE)
BILIRUBIN URINE: NEGATIVE
GLUCOSE, UA: NEGATIVE mg/dL
Ketones, ur: NEGATIVE mg/dL
NITRITE: NEGATIVE
Protein, ur: NEGATIVE mg/dL
Specific Gravity, Urine: 1.02 (ref 1.005–1.030)
Urobilinogen, UA: 0.2 mg/dL (ref 0.0–1.0)
pH: 8.5 — ABNORMAL HIGH (ref 5.0–8.0)

## 2014-12-19 LAB — OB RESULTS CONSOLE GBS: GBS: NEGATIVE

## 2014-12-19 LAB — OB RESULTS CONSOLE GC/CHLAMYDIA
Chlamydia: NEGATIVE
GC PROBE AMP, GENITAL: NEGATIVE

## 2014-12-19 NOTE — Patient Instructions (Signed)

## 2014-12-19 NOTE — Progress Notes (Signed)
Doing well. Cultures and GBS done. Labor precautions. Reviewed.

## 2014-12-20 LAB — GC/CHLAMYDIA PROBE AMP
CT Probe RNA: NEGATIVE
GC Probe RNA: NEGATIVE

## 2014-12-21 LAB — CULTURE, BETA STREP (GROUP B ONLY)

## 2014-12-30 ENCOUNTER — Encounter (HOSPITAL_COMMUNITY): Payer: Self-pay | Admitting: *Deleted

## 2014-12-30 ENCOUNTER — Inpatient Hospital Stay (HOSPITAL_COMMUNITY)
Admission: AD | Admit: 2014-12-30 | Discharge: 2014-12-30 | Disposition: A | Discharge status: Home / Self Care | Payer: Medicaid Other | Source: Ambulatory Visit | Attending: Obstetrics and Gynecology | Admitting: Obstetrics and Gynecology

## 2014-12-30 DIAGNOSIS — O368130 Decreased fetal movements, third trimester, not applicable or unspecified: Secondary | ICD-10-CM | POA: Insufficient documentation

## 2014-12-30 NOTE — Progress Notes (Signed)
Lori Clemmons CNM notified of pt's complaints of decrease in fetal movement, states she will come and evaluate pt

## 2014-12-30 NOTE — MAU Note (Addendum)
Pt presents to MAU with complaints of a decrease in fetal movement. Reports she has not felt movement since late last night. Denies any vaginal bleeding or LOF

## 2014-12-30 NOTE — H&P (Signed)
Chief Complaint:  Decreased Fetal Movement   Kaylee Kelly is a 29 y.o.  G2P1001 with IUP at [redacted]w[redacted]d presenting for Decreased Fetal Movement to the MAU. Since yesterday evening 7pm felt no baby movement. Denies trauma, falls, smoking, alcohol, illicit substances, changes to diet. Pt was cleaning the house and realized after a few hours she has not felt baby moving. Pt tried eating, drinking more water, family members rubbed belly, no changes in FM. Patient states she has been having no contractions, no vaginal bleeding, intact membranes. Pt currently feels baby moving since the beginning of this interview.   Denies itching, dysuria, vaginal discharge. Denies chest pain, dyspnea, leg pain.   Prenatal care [facility], no complications, no abnormalities. Last pregnancy she was "carrying low" and daughter was born by C-section.   Menstrual History: OB History    Gravida Para Term Preterm AB TAB SAB Ectopic Multiple Living   2 1 1       1        No LMP recorded. Patient is pregnant.      Past Medical History  Diagnosis Date  . Anemia   . Headache     Past Surgical History  Procedure Laterality Date  . Cesarean section    . Appendectomy      Family History  Problem Relation Age of Onset  . Hyperlipidemia Father   . Diabetes Maternal Grandmother     History  Substance Use Topics  . Smoking status: Never Smoker   . Smokeless tobacco: Never Used  . Alcohol Use: Yes     Comment: socially; none now with preg      No Known Allergies  Prescriptions prior to admission  Medication Sig Dispense Refill Last Dose  . Prenatal Multivit-Min-Fe-FA (PRENATAL VITAMINS) 0.8 MG tablet Take 1 tablet by mouth daily. 30 tablet 12 12/29/2014 at Unknown time    Review of Systems - Negative except for what is mentioned in HPI.  Physical Exam  Blood pressure 123/75, pulse 91, temperature 98.2 F (36.8 C), resp. rate 16. GENERAL: Well-developed, well-nourished female in no acute distress.  LUNGS: Clear  to auscultation bilaterally.  HEART: Regular rate and rhythm. ABDOMEN: Soft, nontender, nondistended, gravid.  EXTREMITIES: Nontender, no edema, 2+ distal pulses.Presentation:  FHT:  Baseline rate  bpm   Variability moderate  Accelerations present   Decelerations none Contractions: no uc's   Labs: No results found for this or any previous visit (from the past 24 hour(s)).  Imaging Studies:  No results found.  Assessment: Kaylee Kelly is  29 y.o. G2P1001 at [redacted]w[redacted]d presents with Decreased Fetal Movement .  Plan: NST- Reactive  Reassured pt   Discharge to home  [facility] [place]  6/11/20162:10 PM

## 2014-12-30 NOTE — Discharge Instructions (Signed)
Evaluación de los movimientos fetales  °(Fetal Movement Counts) °Nombre del paciente: __________________________________________________ Fecha de parto estimada: ____________________ °La evaluación de los movimientos fetales es muy recomendable en los embarazos de alto riesgo, pero también es una buena idea que lo hagan todas las embarazadas. El médico le indicará que comience a contarlos a las 28 semanas de embarazo. Los movimientos fetales suelen aumentar:  °· Después de una comida completa. °· Después de la actividad física. °· Después de comer o beber algo dulce o frío. °· En reposo. °Preste atención cuando sienta que el bebé está más activo. Esto le ayudará a notar un patrón de ciclos de vigilia y sueño de su bebé y cuáles son los factores que contribuyen a un aumento de los movimientos fetales. Es importante llevar a cabo un recuento de movimientos fetales, al mismo tiempo cada día, cuando el bebé normalmente está más activo.  °CÓMO CONTAR LOS MOVIMIENTOS FETALES °1. Busque un lugar tranquilo y cómodo para sentarse o recostarse sobre el lado izquierdo. Al recostarse sobre su lado izquierdo, le proporciona una mejor circulación de sangre y oxígeno al bebé. °2. Anote el día y la hora en una hoja de papel o en un diario. °3. Comience contando las pataditas, revoloteos, chasquidos, vueltas o pinchazos en un período de 2 horas. Debe sentir al menos 10 movimientos en 2 horas. °4. Si no siente 10 movimientos en 2 horas, espere 2 ó 3 horas y cuente de nuevo. Busque cambios en el patrón o si no cuenta lo suficiente en 2 horas. °SOLICITE ATENCIÓN MÉDICA SI:  °· Siente menos de 10 pataditas en 2 horas, en dos intentos. °· No hay movimientos durante una hora. °· El patrón se modifica o le lleva más tiempo cada día contar las 10 pataditas. °· Siente que el bebé no se mueve como lo hace habitualmente. °Fecha: ____________ Movimientos: ____________ Hora de inicio: ____________ Hora de finalización: ____________  °Fecha:  ____________ Movimientos: ____________ Hora de inicio: ____________ Hora de finalización: ____________  °Fecha: ____________ Movimientos: ____________ Hora de inicio: ____________ Hora de finalización: ____________  °Fecha: ____________ Movimientos: ____________ Hora de inicio: ____________ Hora de finalización: ____________  °Fecha: ____________ Movimientos: ____________ Hora de inicio: ____________ Hora de finalización: ____________  °Fecha: ____________ Movimientos: ____________ Hora de inicio: ____________ Hora de finalización: ____________  °Fecha: ____________ Movimientos: ____________ Hora de inicio: ____________ Hora de finalización: ____________  °Fecha: ____________ Movimientos: ____________ Hora de inicio: ____________ Hora de finalización: ____________  °Fecha: ____________ Movimientos: ____________ Hora de inicio: ____________ Hora de finalización: ____________  °Fecha: ____________ Movimientos: ____________ Hora de inicio: ____________ Hora de finalización: ____________  °Fecha: ____________ Movimientos: ____________ Hora de inicio: ____________ Hora de finalización: ____________  °Fecha: ____________ Movimientos: ____________ Hora de inicio: ____________ Hora de finalización: ____________  °Fecha: ____________ Movimientos: ____________ Hora de inicio: ____________ Hora de finalización: ____________  °Fecha: ____________ Movimientos: ____________ Hora de inicio: ____________ Hora de finalización: ____________  °Fecha: ____________ Movimientos: ____________ Hora de inicio: ____________ Hora de finalización: ____________  °Fecha: ____________ Movimientos: ____________ Hora de inicio: ____________ Hora de finalización: ____________  °Fecha: ____________ Movimientos: ____________ Hora de inicio: ____________ Hora de finalización: ____________  °Fecha: ____________ Movimientos: ____________ Hora de inicio: ____________ Hora de finalización: ____________  °Fecha: ____________ Movimientos: ____________ Hora  de inicio: ____________ Hora de finalización: ____________  °Fecha: ____________ Movimientos: ____________ Hora de inicio: ____________ Hora de finalización: ____________  °Fecha: ____________ Movimientos: ____________ Hora de inicio: ____________ Hora de finalización: ____________  °Fecha: ____________ Movimientos: ____________ Hora de inicio: ____________ Hora de   finalización: ____________  °Fecha: ____________ Movimientos: ____________ Hora de inicio: ____________ Hora de finalización: ____________  °Fecha: ____________ Movimientos: ____________ Hora de inicio: ____________ Hora de finalización: ____________  °Fecha: ____________ Movimientos: ____________ Hora de inicio: ____________ Hora de finalización: ____________  °Fecha: ____________ Movimientos: ____________ Hora de inicio: ____________ Hora de finalización: ____________  °Fecha: ____________ Movimientos: ____________ Hora de inicio: ____________ Hora de finalización: ____________  °Fecha: ____________ Movimientos: ____________ Hora de inicio: ____________ Hora de finalización: ____________  °Fecha: ____________ Movimientos: ____________ Hora de inicio: ____________ Hora de finalización: ____________  °Fecha: ____________ Movimientos: ____________ Hora de inicio: ____________ Hora de finalización: ____________  °Fecha: ____________ Movimientos: ____________ Hora de inicio: ____________ Hora de finalización: ____________  °Fecha: ____________ Movimientos: ____________ Hora de inicio: ____________ Hora de finalización: ____________  °Fecha: ____________ Movimientos: ____________ Hora de inicio: ____________ Hora de finalización: ____________  °Fecha: ____________ Movimientos: ____________ Hora de inicio: ____________ Hora de finalización: ____________  °Fecha: ____________ Movimientos: ____________ Hora de inicio: ____________ Hora de finalización: ____________  °Fecha: ____________ Movimientos: ____________ Hora de inicio: ____________ Hora de finalización:  ____________  °Fecha: ____________ Movimientos: ____________ Hora de inicio: ____________ Hora de finalización: ____________  °Fecha: ____________ Movimientos: ____________ Hora de inicio: ____________ Hora de finalización: ____________  °Fecha: ____________ Movimientos: ____________ Hora de inicio: ____________ Hora de finalización: ____________  °Fecha: ____________ Movimientos: ____________ Hora de inicio: ____________ Hora de finalización: ____________  °Fecha: ____________ Movimientos: ____________ Hora de inicio: ____________ Hora de finalización: ____________  °Fecha: ____________ Movimientos: ____________ Hora de inicio: ____________ Hora de finalización: ____________  °Fecha: ____________ Movimientos: ____________ Hora de inicio: ____________ Hora de finalización: ____________  °Fecha: ____________ Movimientos: ____________ Hora de inicio: ____________ Hora de finalización: ____________  °Fecha: ____________ Movimientos: ____________ Hora de inicio: ____________ Hora de finalización: ____________  °Fecha: ____________ Movimientos: ____________ Hora de inicio: ____________ Hora de finalización: ____________  °Fecha: ____________ Movimientos: ____________ Hora de inicio: ____________ Hora de finalización: ____________  °Fecha: ____________ Movimientos: ____________ Hora de inicio: ____________ Hora de finalización: ____________  °Fecha: ____________ Movimientos: ____________ Hora de inicio: ____________ Hora de finalización: ____________  °Fecha: ____________ Movimientos: ____________ Hora de inicio: ____________ Hora de finalización: ____________  °Fecha: ____________ Movimientos: ____________ Hora de inicio: ____________ Hora de finalización: ____________  °Fecha: ____________ Movimientos: ____________ Hora de inicio: ____________ Hora de finalización: ____________  °Fecha: ____________ Movimientos: ____________ Hora de inicio: ____________ Hora de finalización: ____________  °Fecha: ____________  Movimientos: ____________ Hora de inicio: ____________ Hora de finalización: ____________  °Fecha: ____________ Movimientos: ____________ Hora de inicio: ____________ Hora de finalización: ____________  °Fecha: ____________ Movimientos: ____________ Hora de inicio: ____________ Hora de finalización: ____________  °Document Released: 10/14/2007 Document Revised: 06/23/2012 °ExitCare® Patient Information ©2015 [employer]. This information is not intended to replace advice given to you by your health care provider. Make sure you discuss any questions you have with your health care provider. ° °

## 2015-01-02 ENCOUNTER — Ambulatory Visit (INDEPENDENT_AMBULATORY_CARE_PROVIDER_SITE_OTHER): Payer: Medicaid Other | Admitting: Advanced Practice Midwife

## 2015-01-02 ENCOUNTER — Telehealth (HOSPITAL_COMMUNITY): Payer: Self-pay | Admitting: *Deleted

## 2015-01-02 VITALS — BP 118/64 | HR 82 | Temp 98.1°F | Wt 157.5 lb

## 2015-01-02 DIAGNOSIS — O322XX1 Maternal care for transverse and oblique lie, fetus 1: Secondary | ICD-10-CM

## 2015-01-02 DIAGNOSIS — Z3493 Encounter for supervision of normal pregnancy, unspecified, third trimester: Secondary | ICD-10-CM

## 2015-01-02 LAB — POCT URINALYSIS DIP (DEVICE)
Bilirubin Urine: NEGATIVE
Glucose, UA: NEGATIVE mg/dL
Hgb urine dipstick: NEGATIVE
KETONES UR: NEGATIVE mg/dL
NITRITE: NEGATIVE
PH: 6.5 (ref 5.0–8.0)
Protein, ur: NEGATIVE mg/dL
Specific Gravity, Urine: 1.02 (ref 1.005–1.030)
Urobilinogen, UA: 1 mg/dL (ref 0.0–1.0)

## 2015-01-02 NOTE — Progress Notes (Signed)
GBS, Cultures Neg. ? Presentation. US shows transverse. ECV scheduled.

## 2015-01-02 NOTE — Progress Notes (Signed)
Bedside US for presentation = Transverse, head on maternal Rt, spine up.  Consult w/Dr. Adrian Blackwater - plan for External Cephalic Version tomorrow.  All instructions given - pt to arrive @ 0645, NPO after midnight.

## 2015-01-02 NOTE — Telephone Encounter (Signed)
Preadmission screen  

## 2015-01-03 ENCOUNTER — Observation Stay (HOSPITAL_COMMUNITY)
Admission: RE | Admit: 2015-01-03 | Discharge: 2015-01-03 | Disposition: A | Discharge status: Home / Self Care | Payer: Medicaid Other | Source: Ambulatory Visit | Attending: Obstetrics and Gynecology | Admitting: Obstetrics and Gynecology

## 2015-01-03 ENCOUNTER — Encounter (HOSPITAL_COMMUNITY): Payer: Self-pay

## 2015-01-03 VITALS — BP 109/71 | HR 86 | Temp 98.0°F | Resp 16 | Ht 64.0 in | Wt 157.0 lb

## 2015-01-03 DIAGNOSIS — Z3A00 Weeks of gestation of pregnancy not specified: Secondary | ICD-10-CM | POA: Insufficient documentation

## 2015-01-03 DIAGNOSIS — O0930 Supervision of pregnancy with insufficient antenatal care, unspecified trimester: Secondary | ICD-10-CM | POA: Insufficient documentation

## 2015-01-03 DIAGNOSIS — O322XX0 Maternal care for transverse and oblique lie, not applicable or unspecified: Secondary | ICD-10-CM | POA: Diagnosis not present

## 2015-01-03 DIAGNOSIS — O321XX0 Maternal care for breech presentation, not applicable or unspecified: Secondary | ICD-10-CM | POA: Diagnosis present

## 2015-01-03 LAB — CBC
HCT: 31.8 % — ABNORMAL LOW (ref 36.0–46.0)
HEMOGLOBIN: 10.3 g/dL — AB (ref 12.0–15.0)
MCH: 26 pg (ref 26.0–34.0)
MCHC: 32.4 g/dL (ref 30.0–36.0)
MCV: 80.3 fL (ref 78.0–100.0)
Platelets: 172 10*3/uL (ref 150–400)
RBC: 3.96 MIL/uL (ref 3.87–5.11)
RDW: 16 % — ABNORMAL HIGH (ref 11.5–15.5)
WBC: 9.8 10*3/uL (ref 4.0–10.5)

## 2015-01-03 LAB — TYPE AND SCREEN
ABO/RH(D): A POS
Antibody Screen: NEGATIVE

## 2015-01-03 MED ORDER — LACTATED RINGERS IV SOLN
INTRAVENOUS | Status: DC
  Administered 2015-01-03: 08:00:00 via INTRAVENOUS

## 2015-01-03 MED ORDER — TERBUTALINE SULFATE 1 MG/ML IJ SOLN
0.2500 mg | Freq: Once | INTRAMUSCULAR | Status: AC
  Administered 2015-01-03: 0.25 mg via SUBCUTANEOUS
  Filled 2015-01-03: qty 1

## 2015-01-03 NOTE — Discharge Instructions (Signed)
Versin ceflica externa (External Cephalic Version) La versin ceflica externa es dar vuelta a un beb que tiene las nalgas hacia adelante o est de costado en el tero (trasverso) para colocarlo en una posicin de cabeza. Esto agiliza el trabajo de parto y el nacimiento, lo hace ms seguro para la madre y el beb y Sports coach la posibilidad de Warehouse manager que realizar una cesrea. No debera realizarse hasta que el embarazo sea de 36 semanas o ms. ANTES DEL PROCEDIMIENTO  No tome aspirina  No coma durante las cuatro horas previas al procedimiento.  Comente al mdico si tiene resfro, fiebre o una infeccin.  Informe a su mdico si sufre contracciones.  Informe al mdico si tiene una prdida de lquido por la vagina.  Informe al mdico si tiene hemorragia vaginal o secrecin anormal.  Si es admitida el mismo da de la [place], Oceanographer al hospital al menos una hora antes del procedimiento para leer y Oceanographer los formularios y Development worker, community, y Theme park manager preparada.  Consulte con el profesional que lo asiste si ha tenido algn problema con anestsicos anteriormente.  Informe al mdico si ha estado tomado medicamentos. Esto incluye medicamentos de venta libre y con receta, hierbas, gotas oftlmicas y cremas. PROCEDIMIENTO  Primero se realiza un ultrasonido para asegurarse de que el beb est de nalgas o trasverso.  Se realizar una prueba de no estrs o perfil biofsico al beb antes de la VCE. Esto se realiza para asegurarse de que es seguro realizar la VCE al beb. Tambin podr realizarse despus del procedimiento para asegurarse de que el beb est bien.  La VCE se realiza en la sala de parto con la presencia de Heritage manager. Deber haber instrumental para una cesrea de emergencia con un equipo de mdicos disponible.  Se le dar a la paciente medicacin para [employer] msculos uterinos. Se podr administrar una vacuna epidural para cualquier molestia. Esto es til para el xito de la  VCE.  Tambin se coloca un monitor electrnico fetal durante el procedimiento para asegurarse de que el beb est bien.  Si la madre es Rh negativo, se administrar Rho-gam para prevenir problemas de Rh en los embarazos futuros.  La madre quedar en observacin durante 2 a 3 horas despus del procedimiento para asegurarse de que no ha habido problemas. BENEFICIOS DE LA VCE  Trabajo de parto ms fcil y seguro para la madre y para el beb.  Menor incidencia de Copy.  Menores costos por parto vaginal. RIESGOS DE LA VCE  La placenta se desplaza de la pared del tero antes del parto (abrupcin de la placenta).  Ruptura del tero, en especial en pacientes con corte de cesrea previo.  Estrs fetal.  Parto prematuro.  Ruptura prematura de las [place].  El beb puede volver a ponerse de nalgas o trasverso.  Puede ocurrir [employer] fetal, pero esto es muy poco comn. LA VCE DEBER DETENERSE SI:  El ritmo cardaco fetal decae.  La madre siente [employer].  No se puede dar vuelta al beb despus de varios intentos. LA VCE NO DEBER EFECTUARSE SI:  La prueba de no estrs o perfil biofsico es anormal.  Hay hemorragia vaginal.  Forma anormal del tero.  Hay insuficiencia cardaca o presin alta no controlada en la madre.  Embarazo de gemelos o ms bebs.  La placenta cubre la apertura del cuello del tero (placenta previa).  Ha tenido una cesrea anterior con una incisin clsica o ciruga mayor del tero.  Insuficiencia de lquido [employer]  bolsa del beb (oligohidramnios).  El beb es muy pequeo o no se ha desarrollado normalmente (anomala).  Ruptura de membranas. INSTRUCCIONES PARA EL CUIDADO DOMICILIARIO  Pdale a alguna persona que la lleve hasta su domicilio despus del procedimiento.  Haga reposo en su casa durante varias horas.  Haga que alguien permanezca con usted cuando regrese a su casa, al menos durante las primeras horas.  Luego de la VCE,  contine con las actividades prenatales segn se le haya indicado.  Contine con su dieta normal, reposo y actividades.  No realice actividades estresantes por un par de das. SOLICITE ATENCIN MDICA DE INMEDIATO SI:  Presenta una hemorragia vaginal abundante.  Presenta una secrecin vaginal (la bolsa de agua podra haberse roto).  Tiene contracciones uterinas.  No siente que el beb se mueve, o percibe menos movimientos que antes.  Siente dolor abdominal.  La temperatura oral se eleva por encima de 38,9 C (102 F) o mayor. Document Released: 12/24/2007 Document Revised: 09/29/2011 ExitCare Patient Information 2015 [employer]. This information is not intended to replace advice given to you by your health care provider. Make sure you discuss any questions you have with your health care provider.  

## 2015-01-03 NOTE — Progress Notes (Signed)
NST reactive. Lujean Rave RNC second reviewer.  No bleeding or leaking noted.  Reviewed D/C instructions with pt and FOB. Copy of instructions given to pt.  Keep appt with Clinic on Tuesday, 6/21.

## 2015-01-03 NOTE — H&P (Addendum)
Preprocedural History and Physical  Kaylee Kelly is a 29 y.o. G2P1001 here for external cephalic version.  Pregnancy complicated by prior cesarean section and transverse lie of baby.  No significant concerns.   Past Medical History  Diagnosis Date  . Anemia   . Headache    Past Surgical History  Procedure Laterality Date  . Cesarean section    . Appendectomy     OB History    Gravida Para Term Preterm AB TAB SAB Ectopic Multiple Living   2 1 1       1      Patient denies any cervical dysplasia or STIs. No current facility-administered medications on file prior to encounter.   Current Outpatient Prescriptions on File Prior to Encounter  Medication Sig Dispense Refill  . Prenatal Multivit-Min-Fe-FA (PRENATAL VITAMINS) 0.8 MG tablet Take 1 tablet by mouth daily. 30 tablet 12   No Known Allergies Social History:   reports that she has never smoked. She has never used smokeless tobacco. She reports that she drinks alcohol. She reports that she does not use illicit drugs.  Family History  Problem Relation Age of Onset  . Hyperlipidemia Father   . Diabetes Maternal Grandmother     Review of Systems: Full 10 systems review of systems preformed, which were normal other than what was stated in the HPI.  PHYSICAL EXAM: There were no vitals taken for this visit. General appearance - alert, well appearing, and in no distress Head - Normocephalic, atraumatic.  Right and left external ears normal. Eyes - EOMI.  Nonicteric.  Normal conjunctiva Neck - supple, no lymphadenopathy.  No tracheal deviation Chest - clear to auscultation, no wheezes, rales or rhonchi, symmetric air entry Heart - normal rate and regular rhythm Abdomen - soft, nontender, nondistended, no masses or organomegaly Pelvic - examination not indicated Extremities - peripheral pulses normal, no pedal edema, no clubbing or cyanosis Skin - Warm to touch. no bruises, rashes, wounds. Neuro - Oriented x3.  Cranial nerves  intact. Psych - normal thought process.  Judgement intact.  Labs: Results for orders placed or performed in visit on 01/02/15 (from the past 336 hour(s))  POCT urinalysis dip (device)   Collection Time: 01/02/15 10:04 AM  Result Value Ref Range   Glucose, UA NEGATIVE NEGATIVE mg/dL   Bilirubin Urine NEGATIVE NEGATIVE   Ketones, ur NEGATIVE NEGATIVE mg/dL   Specific Gravity, Urine 1.020 1.005 - 1.030   Hgb urine dipstick NEGATIVE NEGATIVE   pH 6.5 5.0 - 8.0   Protein, ur NEGATIVE NEGATIVE mg/dL   Urobilinogen, UA 1.0 0.0 - 1.0 mg/dL   Nitrite NEGATIVE NEGATIVE   Leukocytes, UA SMALL (A) NEGATIVE    Imaging Studies: No results found.  Assessment: Patient Active Problem List   Diagnosis Date Noted  . Late prenatal care affecting pregnancy 10/11/2014  . History of cesarean section 08/25/2014  . Insufficient prenatal care in second trimester 08/25/2014  . Yeast infection involving the vagina and surrounding area 08/25/2014   NST - category 1 tracing. Bedside US shows transverse baby - head maternal left.  Plan: Patient will undergo external cephalic version.  The risks of surgery were discussed in detail with the patient including but not limited to: failed version, fetal intolerance and bradycardia, need for emergent cesarean section, placental abruption, uterine rupture.  Patient has been NPO since last night she will remain NPO for procedure.  Anesthesia and OR aware.    Levie Heritage, DO  01/03/2015, 7:36 AM

## 2015-01-03 NOTE — Progress Notes (Signed)
Continue to adjust external monitors.  Good fetal movement audible via monitors and reported by pt.  Will continue to adjust to get reactive NST.

## 2015-01-03 NOTE — Discharge Summary (Signed)
Patient ID: Kaylee Kelly, female   DOB: 19-Apr-1986, 29 y.o.   MRN: [id number]  The above patient was seen on the Birthing suits for an outpatient procedure: external cephalic version, which was successful.  The baby was monitored for 1 hour afterwards, showing reactive FHT with accelerations and moderate variability.  Patient reported no bleeding or spotting or leaking fluid.  Patient will follow up in the clinic for continued prenatal care.  Levie Heritage, DO 01/03/2015, 8:14 AM

## 2015-01-03 NOTE — Progress Notes (Signed)
Patient ID: Kaylee Kelly, female   DOB: [birth year], 29 y.o.   MRN: [id number]  After informed verbal consent, Terbutaline 0.25 mg SQ given, ECV was attempted under Ultrasound guidance.  Baby turned easily with one attempt.   FHR was reactive before and after the procedure.   Pt. Tolerated the procedure well. Discussed possibility of baby turning transverse again.  If so, would be reasonable to do ECV during labor or prior to induction.  Levie Heritage, DO 01/03/2015, 8:07 AM

## 2015-01-04 LAB — ABO/RH: ABO/RH(D): A POS

## 2015-01-05 ENCOUNTER — Encounter: Payer: Self-pay | Admitting: *Deleted

## 2015-01-08 DIAGNOSIS — Z3493 Encounter for supervision of normal pregnancy, unspecified, third trimester: Secondary | ICD-10-CM | POA: Insufficient documentation

## 2015-01-08 DIAGNOSIS — O322XX0 Maternal care for transverse and oblique lie, not applicable or unspecified: Secondary | ICD-10-CM | POA: Insufficient documentation

## 2015-01-09 ENCOUNTER — Encounter: Payer: Self-pay | Admitting: Obstetrics and Gynecology

## 2015-01-09 ENCOUNTER — Ambulatory Visit (INDEPENDENT_AMBULATORY_CARE_PROVIDER_SITE_OTHER): Payer: Medicaid Other | Admitting: Obstetrics and Gynecology

## 2015-01-09 VITALS — BP 117/69 | HR 86 | Temp 98.3°F | Wt 160.2 lb

## 2015-01-09 DIAGNOSIS — Z3493 Encounter for supervision of normal pregnancy, unspecified, third trimester: Secondary | ICD-10-CM

## 2015-01-09 DIAGNOSIS — O0932 Supervision of pregnancy with insufficient antenatal care, second trimester: Secondary | ICD-10-CM | POA: Diagnosis not present

## 2015-01-09 DIAGNOSIS — Z9889 Other specified postprocedural states: Secondary | ICD-10-CM | POA: Diagnosis not present

## 2015-01-09 DIAGNOSIS — Z98891 History of uterine scar from previous surgery: Secondary | ICD-10-CM

## 2015-01-09 LAB — POCT URINALYSIS DIP (DEVICE)
Bilirubin Urine: NEGATIVE
Glucose, UA: NEGATIVE mg/dL
Hgb urine dipstick: NEGATIVE
Ketones, ur: NEGATIVE mg/dL
Nitrite: NEGATIVE
Protein, ur: NEGATIVE mg/dL
Specific Gravity, Urine: 1.015 (ref 1.005–1.030)
Urobilinogen, UA: 1 mg/dL (ref 0.0–1.0)
pH: 7 (ref 5.0–8.0)

## 2015-01-09 NOTE — Progress Notes (Signed)
Discussed breast feeding tip of the week.  

## 2015-01-09 NOTE — Progress Notes (Signed)
Subjective:  Kaylee Kelly is a 29 y.o. G2P1001 at [redacted]w[redacted]d being seen today for ongoing prenatal care.  Patient reports backache. Had successful ECV 6/15; still desires TOLAC.  Contractions: Irregular.  Vag. Bleeding: None. Movement: Present. Denies leaking of fluid.   The following portions of the patient's history were reviewed and updated as appropriate: allergies, current medications, past family history, past medical history, past social history, past surgical history and problem list.   Objective:   Filed Vitals:   01/09/15 0950  BP: 117/69  Pulse: 86  Temp: 98.3 F (36.8 C)  Weight: 160 lb 3.2 oz (72.666 kg)    Fetal Status: Fetal Heart Rate (bpm): 135   Movement: Present     General:  Alert, oriented and cooperative. Patient is in no acute distress.  Skin: Skin is warm and dry. No rash noted.   Cardiovascular: Normal heart rate noted  Respiratory: Effort and breath sounds normal, no problems with respiration noted  Abdomen: Soft, gravid, appropriate for gestational age. Pain/Pressure: Present     Vaginal: Vag. Bleeding: None.       Cervix: post, soft/closed/long/high ballotable head       Extremities: Normal range of motion.  Edema: None  Mental Status: Normal mood and affect. Normal behavior. Normal judgment and thought content.   Urinalysis: Urine Protein: Negative Urine Glucose: Negative  Assessment and Plan:  Pregnancy: G2P1001 at [redacted]w[redacted]d  There are no diagnoses linked to this encounter.  Term labor symptoms and general obstetric precautions including but not limited to vaginal bleeding, contractions, leaking of fluid and fetal movement were reviewed in detail with the patient.  Please refer to After Visit Summary for other counseling recommendations.   Return in about 1 week (around 01/16/2015).   Danae Orleans, CNM

## 2015-01-09 NOTE — Patient Instructions (Signed)

## 2015-01-16 ENCOUNTER — Encounter (HOSPITAL_COMMUNITY): Payer: Self-pay

## 2015-01-16 ENCOUNTER — Ambulatory Visit (INDEPENDENT_AMBULATORY_CARE_PROVIDER_SITE_OTHER): Payer: Medicaid Other | Admitting: Advanced Practice Midwife

## 2015-01-16 ENCOUNTER — Ambulatory Visit (HOSPITAL_COMMUNITY)
Admission: RE | Admit: 2015-01-16 | Discharge: 2015-01-16 | Disposition: A | Discharge status: Home / Self Care | Payer: Medicaid Other | Source: Ambulatory Visit | Attending: Advanced Practice Midwife | Admitting: Advanced Practice Midwife

## 2015-01-16 VITALS — BP 110/66 | HR 85 | Temp 98.6°F | Wt 159.4 lb

## 2015-01-16 DIAGNOSIS — Z9889 Other specified postprocedural states: Secondary | ICD-10-CM | POA: Insufficient documentation

## 2015-01-16 DIAGNOSIS — O3421 Maternal care for scar from previous cesarean delivery: Secondary | ICD-10-CM

## 2015-01-16 DIAGNOSIS — O480 Post-term pregnancy: Secondary | ICD-10-CM | POA: Insufficient documentation

## 2015-01-16 DIAGNOSIS — O0930 Supervision of pregnancy with insufficient antenatal care, unspecified trimester: Secondary | ICD-10-CM

## 2015-01-16 DIAGNOSIS — Z3A40 40 weeks gestation of pregnancy: Secondary | ICD-10-CM | POA: Insufficient documentation

## 2015-01-16 DIAGNOSIS — Z98891 History of uterine scar from previous surgery: Secondary | ICD-10-CM

## 2015-01-16 LAB — POCT URINALYSIS DIP (DEVICE)
Bilirubin Urine: NEGATIVE
GLUCOSE, UA: NEGATIVE mg/dL
Hgb urine dipstick: NEGATIVE
KETONES UR: NEGATIVE mg/dL
Nitrite: NEGATIVE
Protein, ur: NEGATIVE mg/dL
Specific Gravity, Urine: 1.015 (ref 1.005–1.030)
Urobilinogen, UA: 0.2 mg/dL (ref 0.0–1.0)
pH: 8.5 — ABNORMAL HIGH (ref 5.0–8.0)

## 2015-01-16 NOTE — Progress Notes (Signed)
Moderate leukocytes noted on urinalysis. C/o back pain , abdomen hurts when resting on sofa.

## 2015-01-16 NOTE — Progress Notes (Signed)
Increased BH, constant LBP, likely musculoskeletal. Cervix very soft, but closed. ECV 01/03/15 successful, vtx by Leopold's today. Will get NST for post-term tomorrow, check presentation and IOL at 41 weeks.

## 2015-01-17 ENCOUNTER — Telehealth (HOSPITAL_COMMUNITY): Payer: Self-pay | Admitting: *Deleted

## 2015-01-17 NOTE — Telephone Encounter (Signed)
Preadmission screen  

## 2015-01-19 ENCOUNTER — Telehealth: Payer: Self-pay | Admitting: *Deleted

## 2015-01-19 NOTE — Telephone Encounter (Signed)
Pt left message stating that she is scheduled for induction tomorrow. She wants to know if there is anything she can drink or take to start the dilation. I returned pt's call and advised her that there is nothing that she should take or drink prior to her appt for induction. The proper hormones will be administered to start labor and cervical change once she is admitted.  Pt voiced understanding.

## 2015-01-20 ENCOUNTER — Inpatient Hospital Stay (HOSPITAL_COMMUNITY): Admission: RE | Admit: 2015-01-20 | Payer: Medicaid Other | Source: Ambulatory Visit | Admitting: Family Medicine

## 2015-01-20 NOTE — H&P (Signed)
Kaylee LaddDebbie Kelly is a 29 y.o.G2P1@ 40.6 admitted for post dates induction. Maternal Medical History:  Contractions: No contractions  Fetal activity: Perceived fetal activity is normal.   Last perceived fetal movement was within the past hour.    Prenatal complications: no prenatal complications   OB History    Gravida Para Term Preterm AB TAB SAB Ectopic Multiple Living   2 1 1       1      Past Medical History  Diagnosis Date  . Anemia   . Headache    Past Surgical History  Procedure Laterality Date  . Cesarean section    . Appendectomy     Family History: family history includes Diabetes in her maternal grandmother; Hyperlipidemia in her father. Social History:  reports that she has never smoked. She has never used smokeless tobacco. She reports that she drinks alcohol. She reports that she does not use illicit drugs.   Prenatal Transfer Tool  Maternal Diabetes: No Genetic Screening: Normal Maternal Ultrasounds/Referrals: Normal Fetal Ultrasounds or other Referrals:  None Maternal Substance Abuse:  No Significant Maternal Medications:  None Significant Maternal Lab Results:  None Other Comments:  None  Review of Systems  Constitutional: Negative.   HENT: Negative.   Eyes: Negative.   Respiratory: Negative.   Cardiovascular: Negative.   Gastrointestinal: Negative.   Genitourinary: Negative.   Musculoskeletal: Negative.   Skin: Negative.   Neurological: Negative.   Endo/Heme/Allergies: Negative.   Psychiatric/Behavioral: Negative.       There were no vitals taken for this visit. Maternal Exam:  Abdomen: Patient reports no abdominal tenderness. Fetal presentation: vertex  Introitus: Normal vulva. Normal vagina.  Ferning test: not done.  Nitrazine test: not done. Amniotic fluid character: not assessed.     Fetal Exam Fetal Monitor Review: Mode: ultrasound.   Variability: moderate (6-25 bpm).   Pattern: accelerations present.    Fetal State Assessment:  Category I - tracings are normal.     Physical Exam  Constitutional: She is oriented to person, place, and time. She appears well-developed and well-nourished.  HENT:  Head: Normocephalic.  Eyes: Pupils are equal, round, and reactive to light.  Neck: Normal range of motion.  Cardiovascular: Normal rate, regular rhythm, normal heart sounds and intact distal pulses.   Respiratory: Effort normal and breath sounds normal.  GI: Soft. Bowel sounds are normal.  Genitourinary: Vagina normal and uterus normal.  Musculoskeletal: Normal range of motion.  Neurological: She is alert and oriented to person, place, and time. She has normal reflexes.  Skin: Skin is warm and dry.  Psychiatric: She has a normal mood and affect. Her behavior is normal. Judgment and thought content normal.    Prenatal labs: ABO, Rh: --/--/A POS (06/15 0740) Antibody: NEG (06/15 0740) Rubella: 0.97 (04/06 1535) RPR: NON REAC (04/06 1535)  HBsAg: NEGATIVE (04/06 1535)  HIV: NONREACTIVE (04/06 1535)  GBS: Negative (05/31 0000)   Assessment/Plan: 1/50/high. Pit induction of labor   Kaylee Kelly, Kaylee DARLENE 01/20/2015, 8:42 PM

## 2015-01-21 ENCOUNTER — Inpatient Hospital Stay (HOSPITAL_COMMUNITY): Payer: Medicaid Other | Admitting: Anesthesiology

## 2015-01-21 ENCOUNTER — Inpatient Hospital Stay (HOSPITAL_COMMUNITY)
Admission: AD | Admit: 2015-01-21 | Discharge: 2015-01-23 | DRG: 766 | Disposition: A | Discharge status: Home / Self Care | Payer: Medicaid Other | Source: Ambulatory Visit | Attending: Obstetrics & Gynecology | Admitting: Obstetrics & Gynecology

## 2015-01-21 ENCOUNTER — Encounter (HOSPITAL_COMMUNITY): Payer: Self-pay | Admitting: *Deleted

## 2015-01-21 ENCOUNTER — Encounter (HOSPITAL_COMMUNITY)
Admission: AD | Disposition: A | Discharge status: Home / Self Care | Payer: Self-pay | Source: Ambulatory Visit | Attending: Obstetrics & Gynecology

## 2015-01-21 DIAGNOSIS — O321XX0 Maternal care for breech presentation, not applicable or unspecified: Secondary | ICD-10-CM | POA: Diagnosis present

## 2015-01-21 DIAGNOSIS — O3421 Maternal care for scar from previous cesarean delivery: Secondary | ICD-10-CM | POA: Diagnosis present

## 2015-01-21 DIAGNOSIS — O0930 Supervision of pregnancy with insufficient antenatal care, unspecified trimester: Secondary | ICD-10-CM

## 2015-01-21 DIAGNOSIS — O322XX0 Maternal care for transverse and oblique lie, not applicable or unspecified: Secondary | ICD-10-CM | POA: Diagnosis present

## 2015-01-21 DIAGNOSIS — O480 Post-term pregnancy: Secondary | ICD-10-CM | POA: Diagnosis present

## 2015-01-21 DIAGNOSIS — Z3A41 41 weeks gestation of pregnancy: Secondary | ICD-10-CM | POA: Diagnosis present

## 2015-01-21 DIAGNOSIS — Z98891 History of uterine scar from previous surgery: Secondary | ICD-10-CM

## 2015-01-21 LAB — CBC
HEMATOCRIT: 32.2 % — AB (ref 36.0–46.0)
HEMOGLOBIN: 10.3 g/dL — AB (ref 12.0–15.0)
MCH: 25.5 pg — ABNORMAL LOW (ref 26.0–34.0)
MCHC: 32 g/dL (ref 30.0–36.0)
MCV: 79.7 fL (ref 78.0–100.0)
Platelets: 161 10*3/uL (ref 150–400)
RBC: 4.04 MIL/uL (ref 3.87–5.11)
RDW: 16.3 % — AB (ref 11.5–15.5)
WBC: 12.7 10*3/uL — ABNORMAL HIGH (ref 4.0–10.5)

## 2015-01-21 LAB — RPR: RPR Ser Ql: NONREACTIVE

## 2015-01-21 LAB — TYPE AND SCREEN
ABO/RH(D): A POS
ANTIBODY SCREEN: NEGATIVE

## 2015-01-21 SURGERY — Surgical Case
Anesthesia: Spinal

## 2015-01-21 MED ORDER — PHENYLEPHRINE HCL 10 MG/ML IJ SOLN
INTRAMUSCULAR | Status: DC | PRN
  Administered 2015-01-21: 80 ug via INTRAVENOUS

## 2015-01-21 MED ORDER — BUPIVACAINE IN DEXTROSE 0.75-8.25 % IT SOLN
INTRATHECAL | Status: DC | PRN
  Administered 2015-01-21: 1.4 mL via INTRATHECAL

## 2015-01-21 MED ORDER — OXYTOCIN 10 UNIT/ML IJ SOLN
40.0000 [IU] | INTRAVENOUS | Status: DC | PRN
  Administered 2015-01-21: 40 [IU] via INTRAVENOUS

## 2015-01-21 MED ORDER — NALBUPHINE HCL 10 MG/ML IJ SOLN: 5.0000 mg | Freq: Once | INTRAMUSCULAR | Status: AC | PRN

## 2015-01-21 MED ORDER — DIPHENHYDRAMINE HCL 50 MG/ML IJ SOLN: 12.5000 mg | INTRAMUSCULAR | Status: DC | PRN

## 2015-01-21 MED ORDER — PRENATAL MULTIVITAMIN CH
1.0000 | ORAL_TABLET | Freq: Every day | ORAL | Status: DC
  Administered 2015-01-21 – 2015-01-23 (×3): 1 via ORAL
  Filled 2015-01-21 (×3): qty 1

## 2015-01-21 MED ORDER — CITRIC ACID-SODIUM CITRATE 334-500 MG/5ML PO SOLN
30.0000 mL | ORAL | Status: DC | PRN
  Administered 2015-01-21: 30 mL via ORAL
  Filled 2015-01-21: qty 15

## 2015-01-21 MED ORDER — BUPIVACAINE IN DEXTROSE 0.75-8.25 % IT SOLN: INTRATHECAL | Status: DC | PRN

## 2015-01-21 MED ORDER — ONDANSETRON HCL 4 MG/2ML IJ SOLN: 4.0000 mg | Freq: Three times a day (TID) | INTRAMUSCULAR | Status: DC | PRN

## 2015-01-21 MED ORDER — SODIUM CHLORIDE 0.9 % IJ SOLN: 3.0000 mL | INTRAMUSCULAR | Status: DC | PRN

## 2015-01-21 MED ORDER — DIPHENHYDRAMINE HCL 25 MG PO CAPS: 25.0000 mg | ORAL_CAPSULE | Freq: Four times a day (QID) | ORAL | Status: DC | PRN

## 2015-01-21 MED ORDER — ZOLPIDEM TARTRATE 5 MG PO TABS: 5.0000 mg | ORAL_TABLET | Freq: Every evening | ORAL | Status: DC | PRN

## 2015-01-21 MED ORDER — LIDOCAINE HCL (PF) 1 % IJ SOLN: 30.0000 mL | INTRAMUSCULAR | Status: DC | PRN

## 2015-01-21 MED ORDER — NALBUPHINE HCL 10 MG/ML IJ SOLN: 5.0000 mg | INTRAMUSCULAR | Status: DC | PRN

## 2015-01-21 MED ORDER — FENTANYL CITRATE (PF) 100 MCG/2ML IJ SOLN
INTRAMUSCULAR | Status: AC
  Filled 2015-01-21: qty 2

## 2015-01-21 MED ORDER — NALOXONE HCL 1 MG/ML IJ SOLN
1.0000 ug/kg/h | INTRAVENOUS | Status: DC | PRN
  Filled 2015-01-21: qty 2

## 2015-01-21 MED ORDER — ACETAMINOPHEN 325 MG PO TABS: 650.0000 mg | ORAL_TABLET | ORAL | Status: DC | PRN

## 2015-01-21 MED ORDER — OXYTOCIN 10 UNIT/ML IJ SOLN
INTRAMUSCULAR | Status: AC
  Filled 2015-01-21: qty 4

## 2015-01-21 MED ORDER — LACTATED RINGERS IV SOLN
INTRAVENOUS | Status: DC
  Administered 2015-01-21: 17:00:00 via INTRAVENOUS

## 2015-01-21 MED ORDER — PHENYLEPHRINE 8 MG IN D5W 100 ML (0.08MG/ML) PREMIX OPTIME
INJECTION | INTRAVENOUS | Status: DC | PRN
  Administered 2015-01-21: 60 ug/min via INTRAVENOUS

## 2015-01-21 MED ORDER — ACETAMINOPHEN 500 MG PO TABS
ORAL_TABLET | ORAL | Status: AC
  Filled 2015-01-21: qty 2

## 2015-01-21 MED ORDER — DIBUCAINE 1 % RE OINT: 1.0000 "application " | TOPICAL_OINTMENT | RECTAL | Status: DC | PRN

## 2015-01-21 MED ORDER — SIMETHICONE 80 MG PO CHEW
80.0000 mg | CHEWABLE_TABLET | ORAL | Status: DC | PRN
  Administered 2015-01-23: 80 mg via ORAL

## 2015-01-21 MED ORDER — MORPHINE SULFATE 0.5 MG/ML IJ SOLN
INTRAMUSCULAR | Status: AC
Start: 2015-01-21 — End: 2015-01-21
  Filled 2015-01-21: qty 10

## 2015-01-21 MED ORDER — BUPIVACAINE HCL (PF) 0.5 % IJ SOLN
INTRAMUSCULAR | Status: AC
  Filled 2015-01-21: qty 30

## 2015-01-21 MED ORDER — ONDANSETRON HCL 4 MG/2ML IJ SOLN
4.0000 mg | Freq: Four times a day (QID) | INTRAMUSCULAR | Status: DC | PRN
  Administered 2015-01-21: 4 mg via INTRAVENOUS

## 2015-01-21 MED ORDER — MAGNESIUM HYDROXIDE 400 MG/5ML PO SUSP: 30.0000 mL | ORAL | Status: DC | PRN

## 2015-01-21 MED ORDER — BUPIVACAINE IN DEXTROSE 0.75-8.25 % IT SOLN
INTRATHECAL | Status: DC | PRN
Start: 2015-01-21 — End: 2015-01-21

## 2015-01-21 MED ORDER — LACTATED RINGERS IV SOLN
INTRAVENOUS | Status: DC
  Administered 2015-01-21 (×4): via INTRAVENOUS

## 2015-01-21 MED ORDER — OXYTOCIN BOLUS FROM INFUSION: 500.0000 mL | INTRAVENOUS | Status: DC

## 2015-01-21 MED ORDER — LACTATED RINGERS IV SOLN: 500.0000 mL | INTRAVENOUS | Status: DC | PRN

## 2015-01-21 MED ORDER — CEFAZOLIN SODIUM-DEXTROSE 2-3 GM-% IV SOLR
INTRAVENOUS | Status: DC | PRN
  Administered 2015-01-21: 2 g via INTRAVENOUS

## 2015-01-21 MED ORDER — SIMETHICONE 80 MG PO CHEW
80.0000 mg | CHEWABLE_TABLET | ORAL | Status: DC
  Administered 2015-01-21 – 2015-01-23 (×2): 80 mg via ORAL
  Filled 2015-01-21 (×2): qty 1

## 2015-01-21 MED ORDER — PHENYLEPHRINE 40 MCG/ML (10ML) SYRINGE FOR IV PUSH (FOR BLOOD PRESSURE SUPPORT)
PREFILLED_SYRINGE | INTRAVENOUS | Status: AC
  Filled 2015-01-21: qty 10

## 2015-01-21 MED ORDER — OXYCODONE-ACETAMINOPHEN 5-325 MG PO TABS: 1.0000 | ORAL_TABLET | ORAL | Status: DC | PRN

## 2015-01-21 MED ORDER — SCOPOLAMINE 1 MG/3DAYS TD PT72
1.0000 | MEDICATED_PATCH | Freq: Once | TRANSDERMAL | Status: DC
  Administered 2015-01-21: 1.5 mg via TRANSDERMAL

## 2015-01-21 MED ORDER — FENTANYL CITRATE (PF) 100 MCG/2ML IJ SOLN
INTRAMUSCULAR | Status: DC | PRN
  Administered 2015-01-21: 25 ug via INTRATHECAL

## 2015-01-21 MED ORDER — OXYCODONE-ACETAMINOPHEN 5-325 MG PO TABS: 2.0000 | ORAL_TABLET | ORAL | Status: DC | PRN

## 2015-01-21 MED ORDER — MEASLES, MUMPS & RUBELLA VAC ~~LOC~~ INJ
0.5000 mL | INJECTION | Freq: Once | SUBCUTANEOUS | Status: AC
  Administered 2015-01-23: 0.5 mL via SUBCUTANEOUS
  Filled 2015-01-21: qty 0.5

## 2015-01-21 MED ORDER — ONDANSETRON HCL 4 MG/2ML IJ SOLN
INTRAMUSCULAR | Status: AC
  Filled 2015-01-21: qty 2

## 2015-01-21 MED ORDER — OXYTOCIN 40 UNITS IN LACTATED RINGERS INFUSION - SIMPLE MED
62.5000 mL/h | INTRAVENOUS | Status: DC
  Filled 2015-01-21: qty 1000

## 2015-01-21 MED ORDER — ACETAMINOPHEN 500 MG PO TABS
1000.0000 mg | ORAL_TABLET | Freq: Four times a day (QID) | ORAL | Status: AC
  Administered 2015-01-21 (×3): 1000 mg via ORAL
  Filled 2015-01-21 (×2): qty 2

## 2015-01-21 MED ORDER — OXYTOCIN 40 UNITS IN LACTATED RINGERS INFUSION - SIMPLE MED
1.0000 m[IU]/min | INTRAVENOUS | Status: DC
  Administered 2015-01-21: 2 m[IU]/min via INTRAVENOUS

## 2015-01-21 MED ORDER — OXYCODONE-ACETAMINOPHEN 5-325 MG PO TABS
1.0000 | ORAL_TABLET | ORAL | Status: DC | PRN
  Administered 2015-01-22: 1 via ORAL
  Filled 2015-01-21: qty 1

## 2015-01-21 MED ORDER — FLEET ENEMA 7-19 GM/118ML RE ENEM: 1.0000 | ENEMA | RECTAL | Status: DC | PRN

## 2015-01-21 MED ORDER — CEFAZOLIN SODIUM-DEXTROSE 2-3 GM-% IV SOLR
INTRAVENOUS | Status: AC
  Filled 2015-01-21: qty 50

## 2015-01-21 MED ORDER — SCOPOLAMINE 1 MG/3DAYS TD PT72
MEDICATED_PATCH | TRANSDERMAL | Status: AC
  Administered 2015-01-21: 1.5 mg via TRANSDERMAL
  Filled 2015-01-21: qty 1

## 2015-01-21 MED ORDER — NALOXONE HCL 0.4 MG/ML IJ SOLN: 0.4000 mg | INTRAMUSCULAR | Status: DC | PRN

## 2015-01-21 MED ORDER — LANOLIN HYDROUS EX OINT: 1.0000 "application " | TOPICAL_OINTMENT | CUTANEOUS | Status: DC | PRN

## 2015-01-21 MED ORDER — MORPHINE SULFATE (PF) 0.5 MG/ML IJ SOLN
INTRAMUSCULAR | Status: DC | PRN
  Administered 2015-01-21: .1 mg via INTRATHECAL

## 2015-01-21 MED ORDER — PHENYLEPHRINE 8 MG IN D5W 100 ML (0.08MG/ML) PREMIX OPTIME
INJECTION | INTRAVENOUS | Status: AC
  Filled 2015-01-21: qty 100

## 2015-01-21 MED ORDER — MEPERIDINE HCL 25 MG/ML IJ SOLN: 6.2500 mg | INTRAMUSCULAR | Status: DC | PRN

## 2015-01-21 MED ORDER — SENNOSIDES-DOCUSATE SODIUM 8.6-50 MG PO TABS
2.0000 | ORAL_TABLET | ORAL | Status: DC
  Administered 2015-01-21: 2 via ORAL
  Filled 2015-01-21 (×2): qty 2

## 2015-01-21 MED ORDER — TERBUTALINE SULFATE 1 MG/ML IJ SOLN
0.2500 mg | Freq: Once | INTRAMUSCULAR | Status: AC | PRN
  Administered 2015-01-21: 0.25 mg via SUBCUTANEOUS
  Filled 2015-01-21: qty 1

## 2015-01-21 MED ORDER — TETANUS-DIPHTH-ACELL PERTUSSIS 5-2.5-18.5 LF-MCG/0.5 IM SUSP: 0.5000 mL | Freq: Once | INTRAMUSCULAR | Status: DC

## 2015-01-21 MED ORDER — HYDROMORPHONE HCL 1 MG/ML IJ SOLN: 0.2500 mg | INTRAMUSCULAR | Status: DC | PRN

## 2015-01-21 MED ORDER — OXYTOCIN 40 UNITS IN LACTATED RINGERS INFUSION - SIMPLE MED: 62.5000 mL/h | INTRAVENOUS | Status: AC

## 2015-01-21 MED ORDER — DIPHENHYDRAMINE HCL 25 MG PO CAPS: 25.0000 mg | ORAL_CAPSULE | ORAL | Status: DC | PRN

## 2015-01-21 MED ORDER — MENTHOL 3 MG MT LOZG: 1.0000 | LOZENGE | OROMUCOSAL | Status: DC | PRN

## 2015-01-21 MED ORDER — IBUPROFEN 600 MG PO TABS
600.0000 mg | ORAL_TABLET | Freq: Four times a day (QID) | ORAL | Status: DC
  Administered 2015-01-21 – 2015-01-23 (×8): 600 mg via ORAL
  Filled 2015-01-21 (×8): qty 1

## 2015-01-21 MED ORDER — WITCH HAZEL-GLYCERIN EX PADS
1.0000 | MEDICATED_PAD | CUTANEOUS | Status: DC | PRN
Start: 2015-01-21 — End: 2015-01-23

## 2015-01-21 SURGICAL SUPPLY — 35 items
CLAMP CORD UMBIL (MISCELLANEOUS) IMPLANT
CLOTH BEACON ORANGE TIMEOUT ST (SAFETY) ×3 IMPLANT
DRAPE SHEET LG 3/4 BI-LAMINATE (DRAPES) IMPLANT
DRSG OPSITE POSTOP 4X10 (GAUZE/BANDAGES/DRESSINGS) ×3 IMPLANT
DRSG TELFA 3X8 NADH (GAUZE/BANDAGES/DRESSINGS) ×3 IMPLANT
DURAPREP 26ML APPLICATOR (WOUND CARE) ×3 IMPLANT
ELECT REM PT RETURN 9FT ADLT (ELECTROSURGICAL) ×3
ELECTRODE REM PT RTRN 9FT ADLT (ELECTROSURGICAL) ×1 IMPLANT
EXTRACTOR VACUUM M CUP 4 TUBE (SUCTIONS) IMPLANT
EXTRACTOR VACUUM M CUP 4' TUBE (SUCTIONS)
GLOVE BIOGEL PI IND STRL 7.0 (GLOVE) ×2 IMPLANT
GLOVE BIOGEL PI INDICATOR 7.0 (GLOVE) ×4
GLOVE ECLIPSE 7.0 STRL STRAW (GLOVE) ×3 IMPLANT
GOWN STRL REUS W/TWL LRG LVL3 (GOWN DISPOSABLE) ×6 IMPLANT
HEMOSTAT SURGICEL 2X3 (HEMOSTASIS) ×3 IMPLANT
KIT ABG SYR 3ML LUER SLIP (SYRINGE) IMPLANT
NEEDLE HYPO 22GX1.5 SAFETY (NEEDLE) ×3 IMPLANT
NEEDLE HYPO 25X5/8 SAFETYGLIDE (NEEDLE) ×3 IMPLANT
NS IRRIG 1000ML POUR BTL (IV SOLUTION) ×3 IMPLANT
PACK C SECTION WH (CUSTOM PROCEDURE TRAY) ×3 IMPLANT
PAD ABD 7.5X8 STRL (GAUZE/BANDAGES/DRESSINGS) ×3 IMPLANT
PAD OB MATERNITY 4.3X12.25 (PERSONAL CARE ITEMS) ×3 IMPLANT
RTRCTR C-SECT PINK 25CM LRG (MISCELLANEOUS) IMPLANT
SUT PDS AB 0 CT1 27 (SUTURE) IMPLANT
SUT PDS AB 0 CTX 36 PDP370T (SUTURE) ×3 IMPLANT
SUT PLAIN 2 0 XLH (SUTURE) ×3 IMPLANT
SUT VIC AB 0 CT1 36 (SUTURE) ×6 IMPLANT
SUT VIC AB 0 CTX 36 (SUTURE) ×4
SUT VIC AB 0 CTX36XBRD ANBCTRL (SUTURE) ×2 IMPLANT
SUT VIC AB 3-0 CT1 27 (SUTURE) ×2
SUT VIC AB 3-0 CT1 TAPERPNT 27 (SUTURE) ×1 IMPLANT
SUT VIC AB 4-0 KS 27 (SUTURE) ×3 IMPLANT
SYR CONTROL 10ML LL (SYRINGE) ×3 IMPLANT
TOWEL OR 17X24 6PK STRL BLUE (TOWEL DISPOSABLE) ×3 IMPLANT
TRAY FOLEY CATH SILVER 14FR (SET/KITS/TRAYS/PACK) ×3 IMPLANT

## 2015-01-21 NOTE — Anesthesia Postprocedure Evaluation (Signed)
  Anesthesia Post-op Note  Patient: Kaylee Kelly  Procedure(s) Performed: Procedure(s): CESAREAN SECTION (N/A)  Patient is awake, responsive, moving her legs, and has signs of resolution of her numbness. Pain and nausea are reasonably well controlled. Vital signs are stable and clinically acceptable. Oxygen saturation is clinically acceptable. There are no apparent anesthetic complications at this time. Patient is ready for discharge.

## 2015-01-21 NOTE — Progress Notes (Signed)
Patient ID: Ileana LaddDebbie Cerniglia, female   DOB: [birth year], 29 y.o.   MRN: [id number]   External Cephalic Version  Preprocedure Diagnosis:  29 y.o. G2P1001 at 2541 weeks gestational age with incomplete breech presentation  Post-procedure Diagnosis: 29 y.o. G2P1001 at 341 weeks gestational age with incomplete breech.  Procedure:  Attempted external cephalic version version  Procedure in detail:   The patient was brought to Labor and Delivery where a reactive fetal heart tracing was obtained. The patient was noted to have irregular contractions. She was given 1 dose of subcutaneous terbutaline which resolved her contraction. A bedside ultrasound was performed which revealed single intrauterine pregnancy and breech presentation. There was noted to be adequate fluid. Using manual pressure, the breech was manipulated in a forward roll fashion until a vertex presentation was obtained. Fetal heart tones were checked intermittently during the procedure and were noted to be reassuring. After multiple attempts with both forward and reverse rolls, the version was terminated.  The patient was placed on continuous external fetal monitoring. She was noted to have a reassuring and reactive tracing.  Patient was consented for repeat cesarean section and will have the surgery later today.

## 2015-01-21 NOTE — Anesthesia Procedure Notes (Signed)

## 2015-01-21 NOTE — Progress Notes (Signed)
Faculty Practice OB/GYN Attending Note  29 y.o. G2P1001 at 2534w0d admitted for IOL, with persistent breech presentation after two failed version attempts.  Cesarean delivery recommended. The risks of cesarean section discussed with the patient included but were not limited to: bleeding which may require transfusion or reoperation; infection which may require antibiotics; injury to bowel, bladder, ureters or other surrounding organs; injury to the fetus; need for additional procedures including hysterectomy in the event of a life-threatening hemorrhage; placental abnormalities wth subsequent pregnancies, incisional problems, thromboembolic phenomenon and other postoperative/anesthesia complications. The patient concurred with the proposed plan, giving informed written consent for the procedure.   Patient has been NPO since last night she will remain NPO for procedure. Reactive FHR tracing currently.  Anesthesia and OR aware. Preoperative prophylactic antibiotics and SCDs ordered on call to the OR.  To OR when ready.   Jaynie CollinsUGONNA  ANYANWU, MD, FACOG Attending Obstetrician & Gynecologist Faculty Practice, [facility]

## 2015-01-21 NOTE — Transfer of Care (Signed)
Immediate Anesthesia Transfer of Care Note  Patient: Kaylee LaddDebbie Kelly  Procedure(s) Performed: Procedure(s): CESAREAN SECTION (N/A)  Patient Location: PACU  Anesthesia Type:Spinal  Level of Consciousness: awake  Airway & Oxygen Therapy: Patient Spontanous Breathing  Post-op Assessment: Report given to RN  Post vital signs: Reviewed and stable  Last Vitals:  Filed Vitals:   01/21/15 1026  BP:   Pulse:   Temp:   Resp: 18    Complications: No apparent anesthesia complications

## 2015-01-21 NOTE — Anesthesia Preprocedure Evaluation (Signed)

## 2015-01-21 NOTE — Op Note (Signed)
Kaylee Laddebbie Witherow PROCEDURE DATE: 01/21/2015  PREOPERATIVE DIAGNOSES: Intrauterine pregnancy at 4257w0d weeks gestation; malpresentation,  Breech presentation, previous cesarean section, failed external cephalic version  POSTOPERATIVE DIAGNOSES: The same, transverse presentation, uterine adhesions, right adnexal adhesions  PROCEDURE: Repeat Low Transverse Cesarean Section  SURGEON:  Dr. Jaynie CollinsUgonna Naleigha Raimondi  ASSISTANT:  Dr. Fredirick LatheKristy Acosta  ANESTHESIOLOGIST: Dr. Cristela BlueKyle Jackson  INDICATIONS: Kaylee Kelly is a 29 y.o. G2P1001 at 8657w0d here for cesarean section secondary to the indications listed under preoperative diagnoses; please see preoperative note for further details.  The risks of cesarean section were discussed with the patient including but were not limited to: bleeding which may require transfusion or reoperation; infection which may require antibiotics; injury to bowel, bladder, ureters or other surrounding organs; injury to the fetus; need for additional procedures including hysterectomy in the event of a life-threatening hemorrhage; placental abnormalities wth subsequent pregnancies, incisional problems, thromboembolic phenomenon and other postoperative/anesthesia complications.   The patient concurred with the proposed plan, giving informed written consent for the procedure.    FINDINGS:  Viable female infant in transverse presentation.  Apgars 9 and 9.  Clear amniotic fluid.  Intact placenta, three vessel cord.  Uterine adhesions, adhered right adnexa, normal left fallopian tubes and ovaries  ANESTHESIA: Spinal INTRAVENOUS FLUIDS: 2800 ml ESTIMATED BLOOD LOSS: 1000 ml URINE OUTPUT:  200 ml SPECIMENS: Placenta sent to L&D COMPLICATIONS: None  PROCEDURE IN DETAIL:  The patient preoperatively received intravenous antibiotics and had sequential compression devices applied to her lower extremities.  She was then taken to the operating room where spinal anesthesia was administered and was found to be  adequate. She was then placed in a dorsal supine position with a leftward tilt, and prepped and draped in a sterile manner.  A foley catheter was placed into her bladder and attached to constant gravity.  After an adequate timeout was performed, a Pfannenstiel skin incision was made with scalpel and carried through to the underlying layer of fascia. The fascia was incised in the midline, and this incision was extended bilaterally using the Mayo scissors.  Kocher clamps were applied to the superior aspect of the fascial incision and the underlying rectus muscles were dissected off bluntly. A similar process was carried out on the inferior aspect of the fascial incision. The rectus muscles were separated in the midline bluntly and the peritoneum was entered sharply with metzebnaums secondary to abdominal wall adhesions. Muscles transected bilaterally with bovi.  Large serosal adhesion taken down with Kelly clamp and ties.  Attention was turned to the lower uterine segment where a low transverse hysterotomy was made with a scalpel and extended bilaterally bluntly.  The infant was successfully delivered in a transverse presentation, via breech, the cord was clamped and cut and the infant was handed over to awaiting neonatology team. Uterine massage was then administered, and the placenta delivered intact with a three-vessel cord. The uterus was then cleared of clot and debris.  The hysterotomy was closed with 0 Vicryl in a running locked fashion, and an imbricating layer was also placed with 0 Vicryl. Serosal base of adhesions were noted to be bleeding, figure of eights placed at superior base successfully.  Right adnexal adhesions taken down for better visualization of inferior base of large serosal adhesion.   Inferior base of adhesions required running suture with 0 Vicryl.  The pelvis was cleared of all clot and debris. Hemostasis was confirmed on all surfaces.  The peritoneum and the muscles were reapproximated  using 0 Vicryl interrupted  stitches. The fascia was then closed using 0 PDS in a running fashion.  The subcutaneous layer was irrigated, then reapproximated with 2-0 plain gut interrupted stitches,The skin was closed with a 4-0 Vicryl subcuticular stitch. The patient tolerated the procedure well. Sponge, lap, instrument and needle counts were correct x 2.  She was taken to the recovery room in stable condition.   Perry Mount, MD 12:03 PM  Attestation of Attending Supervision of Obstetric Fellow During Surgery: Surgery was performed by the Obstetric Fellow under my supervision and collaboration.  I was present and scrubbed for the entire procedure.   I have reviewed the Obstetric Fellow's operative report, and I agree with the documentation.   Jaynie Collins, MD, FACOG Attending Obstetrician & Gynecologist Faculty Practice, [facility]

## 2015-01-22 ENCOUNTER — Encounter (HOSPITAL_COMMUNITY): Payer: Self-pay | Admitting: Obstetrics & Gynecology

## 2015-01-22 LAB — CBC
HCT: 24.7 % — ABNORMAL LOW (ref 36.0–46.0)
Hemoglobin: 7.8 g/dL — ABNORMAL LOW (ref 12.0–15.0)
MCH: 25.8 pg — AB (ref 26.0–34.0)
MCHC: 31.6 g/dL (ref 30.0–36.0)
MCV: 81.8 fL (ref 78.0–100.0)
PLATELETS: 138 10*3/uL — AB (ref 150–400)
RBC: 3.02 MIL/uL — ABNORMAL LOW (ref 3.87–5.11)
RDW: 16.4 % — ABNORMAL HIGH (ref 11.5–15.5)
WBC: 10.4 10*3/uL (ref 4.0–10.5)

## 2015-01-22 LAB — BIRTH TISSUE RECOVERY COLLECTION (PLACENTA DONATION)

## 2015-01-22 NOTE — Lactation Note (Signed)
This note was copied from the chart of Boy Ileana LaddDebbie Wander. Lactation Consultation Note  Initial visit made.  Breastfeeding consultation services and support information given to mom.  She states baby is nursing well.  Instructed to feed baby with any hunger cue but at least every 3 hours.  Encouraged to call with concerns/assist prn.  Patient Name: [name] ZOXWR'UToday's Date: 01/22/2015 Reason for consult: Initial assessment   Maternal Data    Feeding    LATCH Score/Interventions                      Lactation Tools Discussed/Used     Consult Status Consult Status: Follow-up Date: 01/23/15 Follow-up type: In-patient    Huston FoleyMOULDEN, Trafton Roker S 01/22/2015, 1:23 PM

## 2015-01-22 NOTE — Progress Notes (Signed)
Post Partum Day 1 Subjective:  Kaylee Kelly is a 29 y.o. [id number] 564w0d s/p rLTCS 2/2 breech presentation.  No acute events overnight.  Pt denies problems with ambulating, voiding or po intake.  She denies nausea or vomiting.  Pain is moderately controlled, feels pain medication is sedating her.  She has had flatus. She has not had bowel movement.  Plan for birth control is nexplanon.  Method of Feeding: breast  Objective: Blood pressure 82/50, pulse 72, temperature 98.5 F (36.9 C), temperature source Oral, resp. rate 20, height 5\' 4"  (1.626 m), weight 159 lb (72.122 kg), SpO2 95 %, unknown if currently breastfeeding.  Physical Exam:  General: alert, cooperative and no distress Lochia:normal flow Chest: CTAB Heart: RRR no m/r/g Abdomen: +BS, soft, nontender,  Uterine Fundus: firm DVT Evaluation: No evidence of DVT seen on physical exam. Extremities: no edema   Recent Labs  01/21/15 0010 01/22/15 0525  HGB 10.3* 7.8*  HCT 32.2* 24.7*    Assessment/Plan:  ASSESSMENT: Kaylee Kelly is a 29 y.o. [id number] 8964w0d s/p rLTCS  Plan for discharge tomorrow   LOS: 1 day   Vail Vuncannon ROCIO 01/22/2015, 7:55 AM

## 2015-01-23 MED ORDER — OXYCODONE-ACETAMINOPHEN 5-325 MG PO TABS: 1.0000 | ORAL_TABLET | ORAL | Status: DC | PRN

## 2015-01-23 MED ORDER — SENNOSIDES-DOCUSATE SODIUM 8.6-50 MG PO TABS: 1.0000 | ORAL_TABLET | Freq: Every evening | ORAL | Status: DC | PRN

## 2015-01-23 NOTE — Lactation Note (Signed)
This note was copied from the chart of Boy Ileana LaddDebbie Burkland. Lactation Consultation Note  Patient Name: Boy Ileana LaddDebbie Akter ZOXWR'UToday's Date: 01/23/2015 Reason for consult: Follow-up assessment;Infant weight loss;Other (Comment) (8% weight loss )  Baby is 4348 hours old an mom desires early D/C. Per mom breast are filling , and heavier , hearing  More swallows. LC reviewed basics of latching, baby hungry and assisted mom with position in football position. Milk easily expressed , baby latched easily with multiply swallows, and gulps.  Baby also had a large wet diaper  Changed before latch. Weight today 7-11.8 oz , 8% weight loss, 8 wets in life . ( one in the last 24 hours @ 2250 , along wit a stool .  LS range 7-9, Breast feeding range - 10 -35 mins.  , Bili at 37 hours - 7.   LC reviewed sore nipple and engorgement prevention and tx. LC showed mom how to use hand pump , #24 Flange ok for today , LC provided a #27 for when the milk comes in.  Mother informed of post-discharge support and given phone number to the lactation department, including services  for phone call assistance; out-patient appointments; and breastfeeding support group. List of other breastfeeding resources  in the community given in the handout. Encouraged mother to call for problems or concerns related to breastfeeding.   Maternal Data Has patient been taught Hand Expression?: Yes Does the patient have breastfeeding experience prior to this delivery?: Yes  Feeding Feeding Type: Breast Fed Length of feed: 10 min  LATCH Score/Interventions Latch: Grasps breast easily, tongue down, lips flanged, rhythmical sucking. Intervention(s): Adjust position;Assist with latch;Breast massage;Breast compression  Audible Swallowing: Spontaneous and intermittent  Type of Nipple: Everted at rest and after stimulation  Comfort (Breast/Nipple): Soft / non-tender     Hold (Positioning): Assistance needed to correctly position infant at breast  and maintain latch. Intervention(s): Breastfeeding basics reviewed;Support Pillows;Position options;Skin to skin  LATCH Score: 9  Lactation Tools Discussed/Used Tools: Pump;Flanges Flange Size: 27 Breast pump type: Manual WIC Program: Yes (per mom [place] ) Pump Review: Setup, frequency, and cleaning Initiated by:: MAI  Date initiated:: 01/23/15   Consult Status Consult Status: Complete Date: 01/23/15    Kathrin Greathouseorio, Arali Somera Ann 01/23/2015, 11:35 AM

## 2015-01-23 NOTE — Discharge Instructions (Signed)

## 2015-01-23 NOTE — Discharge Summary (Signed)
Obstetric Discharge Summary Reason for Admission: induction of labor Prenatal Procedures: none Intrapartum Procedures: cesarean: low cervical, transverse Postpartum Procedures: none Complications-Operative and Postpartum: none HEMOGLOBIN  Date Value Ref Range Status  01/22/2015 7.8* 12.0 - 15.0 g/dL Final    Comment:    REPEATED TO VERIFY DELTA CHECK NOTED    HCT  Date Value Ref Range Status  01/22/2015 24.7* 36.0 - 46.0 % Final   Hsopital Course: Ileana LaddDebbie Dilmore is a 29 y.o. [id number] who presented for IOL 2/2 postdates pregnancy.  Was found to have breech presentation and had 2 failed attempts at external cephalic version.  Was taken for rLTCS.  Post-op course uncomplicated.  Physical Exam:  General: alert, cooperative, appears stated age and no distress Lochia: appropriate Uterine Fundus: firm Incision: healing well, no significant drainage, no dehiscence, no significant erythema DVT Evaluation: No evidence of DVT seen on physical exam. Negative Homan's sign. No cords or calf tenderness. No significant calf/ankle edema. Lungs CTAB  Discharge Diagnoses: Term Pregnancy-delivered  Discharge Information: Date: 01/23/2015 Activity: pelvic rest Diet: routine Medications: Colace and Percocet Condition: stable Instructions: refer to practice specific booklet Discharge to: home   Newborn Data: Live born female  Birth Weight: 8 lb 6.4 oz (3810 g) APGAR: 9, 9  Home with mother.  Erasmo DownerAngela M Bacigalupo, MD, MPH PGY-2,  [facility] 01/23/2015 7:53 AM  Dorathy KinsmanVirginia Esli Jernigan, CNM 01/23/2015 1:32 PM

## 2015-02-12 ENCOUNTER — Inpatient Hospital Stay (HOSPITAL_COMMUNITY)
Admission: AD | Admit: 2015-02-12 | Discharge: 2015-02-12 | Discharge status: Against Medical Advice | Payer: Medicaid Other | Source: Ambulatory Visit | Attending: Obstetrics & Gynecology | Admitting: Obstetrics & Gynecology

## 2015-02-12 DIAGNOSIS — Z5320 Procedure and treatment not carried out because of patient's decision for unspecified reasons: Secondary | ICD-10-CM | POA: Diagnosis not present

## 2015-02-12 DIAGNOSIS — M545 Low back pain: Secondary | ICD-10-CM | POA: Diagnosis present

## 2015-02-12 LAB — URINALYSIS, ROUTINE W REFLEX MICROSCOPIC
Bilirubin Urine: NEGATIVE
Glucose, UA: NEGATIVE mg/dL
Ketones, ur: NEGATIVE mg/dL
Nitrite: NEGATIVE
PROTEIN: NEGATIVE mg/dL
Specific Gravity, Urine: 1.025 (ref 1.005–1.030)
Urobilinogen, UA: 0.2 mg/dL (ref 0.0–1.0)
pH: 5.5 (ref 5.0–8.0)

## 2015-02-12 LAB — URINE MICROSCOPIC-ADD ON

## 2015-02-12 NOTE — MAU Note (Signed)
Pt not in lobby #3  

## 2015-02-12 NOTE — MAU Note (Signed)
Pt post c section on 01/21/2015. C/o of lower back pain and mid abdominal pain that started this morning. Denies n/v/d or constipation. Denies urinary s/s. Bleeding has been okay.

## 2015-02-12 NOTE — MAU Note (Signed)
Pt not in lobby.  

## 2015-02-12 NOTE — Progress Notes (Signed)
Called for patient from lobby not in lobby.

## 2015-02-28 ENCOUNTER — Encounter: Payer: Self-pay | Admitting: Obstetrics & Gynecology

## 2015-02-28 ENCOUNTER — Ambulatory Visit (INDEPENDENT_AMBULATORY_CARE_PROVIDER_SITE_OTHER): Payer: Medicaid Other | Admitting: Obstetrics & Gynecology

## 2015-02-28 DIAGNOSIS — O3421 Maternal care for scar from previous cesarean delivery: Secondary | ICD-10-CM

## 2015-02-28 DIAGNOSIS — Z98891 History of uterine scar from previous surgery: Secondary | ICD-10-CM

## 2015-02-28 DIAGNOSIS — Z3009 Encounter for other general counseling and advice on contraception: Secondary | ICD-10-CM

## 2015-02-28 NOTE — Progress Notes (Signed)
Patient ID: Kaylee Kelly, female   DOB: [birth year], 29 y.o.   MRN: [id number] Subjective:     Kaylee Kelly is a 29 y.o. G33P2002 female who presents for a postpartum visit. She is 5 weeks postpartum following a low cervical transverse cesarean section for breech presentation at [redacted]w[redacted]d. I have fully reviewed the prenatal and intrapartum course.  Postpartum course has been uncomplicated. Baby's course has been uncomplicated. Baby is feeding by breast. Bleeding staining only. Bowel function is normal. Bladder function is normal. Patient is sexually active; no protection used, last episode was 02/26/15. Contraception method is none and desires Nexplanon. Postpartum depression screening: negative.  The following portions of the patient's history were reviewed and updated as appropriate: allergies, current medications, past family history, past medical history, past social history, past surgical history and problem list.  Normal pap in 10/11/14.  Review of Systems Pertinent items are noted in HPI.   Objective:    BP 103/68 mmHg  Pulse 90  Temp(Src) 98.5 F (36.9 C)  Ht  (1.626 m)  Wt 134 lb 3.2 oz (60.873 kg)  BMI 23.02 kg/m2  Breastfeeding? Yes  General:  alert and no distress   Breasts:  inspection negative, no nipple discharge or bleeding, no masses or nodularity palpable  Lungs: clear to auscultation bilaterally  Heart:  regular rate and rhythm  Abdomen: soft, non-tender; bowel sounds normal; no masses,  no organomegaly. Incision C/D/I, healing well.  Pelvic:  not evaluated        Assessment:   Normal postpartum exam. Pap smear not done at today's visit.   Plan:   1. Contraception: deciding about Nexplanon vs Mirena. Risks/benefits discussed. Information givne to her to review at home. 2. Return in two weeks for LARC placement; advised no unprotected IC until then  Kaylee Collins, MD, FACOG Attending Obstetrician & Gynecologist Faculty Practice, [facility]

## 2015-02-28 NOTE — Progress Notes (Addendum)
Here for postpartum visit. States unprotected intercouse 02/26/15. Would like nexplanon but we discussed we need to wait 2 weeks and come back and do a pregnancy test first. Also asked about Plan B.  I informed her Plan B is reccomended to be used within 72 hours and she is still within time frame so she could use Plan B if she desires. We discussed she can buy over the counter or get a prescription - she will discuss with provider.

## 2015-03-21 ENCOUNTER — Encounter: Payer: Self-pay | Admitting: Obstetrics & Gynecology

## 2015-03-21 ENCOUNTER — Ambulatory Visit (INDEPENDENT_AMBULATORY_CARE_PROVIDER_SITE_OTHER): Payer: Medicaid Other | Admitting: Obstetrics & Gynecology

## 2015-03-21 VITALS — BP 109/70 | HR 81 | Temp 98.9°F | Ht 62.0 in | Wt 136.0 lb

## 2015-03-21 DIAGNOSIS — Z30430 Encounter for insertion of intrauterine contraceptive device: Secondary | ICD-10-CM

## 2015-03-21 LAB — POCT PREGNANCY, URINE: Preg Test, Ur: NEGATIVE

## 2015-03-21 MED ORDER — LEVONORGESTREL 20 MCG/24HR IU IUD
INTRAUTERINE_SYSTEM | Freq: Once | INTRAUTERINE | Status: AC
  Administered 2015-03-21: 15:00:00 via INTRAUTERINE

## 2015-03-21 NOTE — Progress Notes (Signed)
Patient ID: Kaylee Kelly, female   DOB: [birth year], 29 y.o.   MRN: [id number] GYNECOLOGY CLINIC PROCEDURE NOTE  Kaylee Kelly is a 29 y.o. G2P2002 here for Mirena IUD insertion. No GYN concerns.  Last pap smear was on 3/823/2016 and was normal.  IUD Insertion Procedure Note Patient identified, informed consent performed.  Discussed risks of irregular bleeding, cramping, infection, malpositioning or misplacement of the IUD outside the uterus which may require further procedures. Time out was performed.  Urine pregnancy test negative.  Speculum placed in the vagina.  Cervix visualized.  Cleaned with Betadine x 2.  Grasped anteriorly with a single tooth tenaculum.  Uterus sounded to 10 cm.  Mirena IUD placed per manufacturer's recommendations.  Strings trimmed to 3 cm. Tenaculum was removed, good hemostasis noted.  Patient tolerated procedure well.   Patient was given post-procedure instructions.  Patient was also asked to follow up in 4 weeks for IUD check.

## 2015-03-21 NOTE — Patient Instructions (Signed)
Levonorgestrel intrauterine device (IUD) Qu es este medicamento? El LEVONORGESTREL (DIU) es un dispositivo anticonceptivo (control de natalidad). El dispositivo se coloca dentro del tero por un profesional de la salud. Se utiliza para evitar el embarazo y tambin se puede utilizar para tratar el sangrado abundante que ocurre durante su perodo. Dependiendo del dispositivo, se puede utilizar por 3 a 5 aos. Este medicamento puede ser utilizado para otros usos; si tiene alguna pregunta consulte con su proveedor de atencin mdica o con su farmacutico. MARCAS COMERCIALES DISPONIBLES: LILETTA, Mirena, Skyla Qu le debo informar a mi profesional de la salud antes de tomar este medicamento? Necesita saber si usted presenta alguno de los siguientes problemas o situaciones: -exmen de Papanicolaou anormal -cncer de mama, cuello del tero o tero -diabetes -endometritis -si tiene una infeccin plvica o genital actual o en el pasado -tiene ms de una pareja sexual o si su pareja tiene ms de una pareja -enfermedad cardiaca -antecedente de embarazo tubrico o ectpico -problemas del sistema inmunolgico -DIU colocado -enfermedad heptica o tumor del hgado -problemas con la coagulacin o si toma diluyentes sanguneos -usa medicamentos intravenoso -forma inusual del tero -sangrado vaginal que no tiene explicacin -una reaccin alrgica o inusual al levonorgestrel, a otras hormonas, a la silicona o polietilenos, a otros medicamentos, alimentos, colorantes o conservantes -si est embarazada o buscando quedar embarazada -si est amamantando a un beb Cmo debo utilizar este medicamento? Un profesional de la salud coloca este dispositivo en el tero. Hable con su pediatra para informarse acerca del uso de este medicamento en nios. Puede requerir atencin especial. Sobredosis: Pngase en contacto inmediatamente con un centro toxicolgico o una sala de urgencia si usted cree que haya tomado  demasiado medicamento. ATENCIN: Este medicamento es solo para usted. No comparta este medicamento con nadie. Qu sucede si me olvido de una dosis? No se aplica en este caso. Qu puede interactuar con este medicamento? No tome esta medicina con ninguno de los siguientes medicamentos: -amprenavir -bosentano -fosamprenavir Esta medicina tambin puede interactuar con los siguientes medicamentos: -aprepitant -barbitricos para producir el sueo o para el tratamiento de convulsiones -bexaroteno -griseofulvina -medicamentos para tratar los convulsiones, tales como carbamazepina, etotona, felbamato, oxcarbazepina, fenitona, topiramato -modafinilo -pioglitazona -rifabutina -rifampicina -rifapentina -algunos medicamentos para tratar el virus VIH, tales como atazanavir, indinavir, lopinavir, nelfinavir, tipranavir, ritonavir -hierba de [place] -warfarina Puede ser que esta lista no menciona todas las posibles interacciones. Informe a su profesional de la salud de todos los productos a base de hierbas, medicamentos de venta libre o suplementos nutritivos que est tomando. Si usted fuma, consume bebidas alcohlicas o si utiliza drogas ilegales, indqueselo tambin a su profesional de la salud. Algunas sustancias pueden interactuar con su medicamento. A qu debo estar atento al usar este medicamento? Visite a su mdico o a su profesional de la salud para chequear su evolucin peridicamente. Visite a su mdico si usted o su pareja tiene relaciones sexuales con otras personas, se vuelve VIH positivo o contrae una enfermedad de transmisin sexual. Este medicamento no la protege de la infeccin por VIH (SIDA) ni de ninguna otra enfermedad de transmisin sexual. Puede controlar la ubicacin del DIU usted misma palpando con sus dedos limpios los hilos en la parte anterior de la vagina. No tire de los hilos. Es un buen hbito controlar la ubicacin del dispositivo despus de cada perodo menstrual. Si  no slo siente los hilos sino que adems siente otra parte ms del DIU o si no puede sentir los hilos, consulte a su   mdico inmediatamente. El DIU puede salirse por s solo. Puede quedar embarazada si el dispositivo se sale de su lugar. Utilice un mtodo anticonceptivo adicional, como preservativos, y consulte a su proveedor de atencin mdica s observa que el DIU se sali de su lugar. La utilizacin de tampones no cambia la posicin del DIU y no hay inconvenientes en usarlos durante su perodo. Qu efectos secundarios puedo tener al utilizar este medicamento? Efectos secundarios que debe informar a su mdico o a su profesional de la salud tan pronto como sea posible: -reacciones alrgicas como erupcin cutnea, picazn o urticarias, hinchazn de la cara, labios o lengua -fiebre, sntomas gripales -llagas genitales -alta presin sangunea -ausencia de un perodo menstrual durante 6 semanas mientras lo utiliza -dolor, hinchazn o calor en las piernas -dolor o sensibilidad del plvico -dolor de cabeza repentino o severo -signos de embarazo -calambres estomacales -falta de aliento repentina -problemas de coordinacin, del habla, al caminar -sangrado, flujo vaginal inusual -color amarillento de los ojos o la piel Efectos secundarios que, por lo general, no requieren atencin mdica (debe informarlos a su mdico o a su profesional de la salud si persisten o si son molestos): -acn -dolor de pecho -cambios en el deseo sexual o capacidad -cambios de peso -calambres, mareos o sensacin de desmayo mientras se introduce el dispositivo -dolor de cabeza -sangrado menstruales irregulares en los primeros 3 a 6 meses de usar -nuseas Puede ser que esta lista no menciona todos los posibles efectos secundarios. Comunquese a su mdico por asesoramiento mdico sobre los efectos secundarios. Usted puede informar los efectos secundarios a la FDA por telfono al 1-800-FDA-1088. Dnde debo guardar mi  medicina? No se aplica en este caso. ATENCIN: Este folleto es un resumen. Puede ser que no cubra toda la posible informacin. Si usted tiene preguntas acerca de esta medicina, consulte con su mdico, su farmacutico o su profesional de la salud.  2015, Elsevier/Gold Standard. (2011-08-26 16:57:41)  

## 2016-01-31 ENCOUNTER — Encounter (HOSPITAL_COMMUNITY): Payer: Self-pay | Admitting: Emergency Medicine

## 2016-01-31 ENCOUNTER — Emergency Department (HOSPITAL_COMMUNITY)
Admission: EM | Admit: 2016-01-31 | Discharge: 2016-01-31 | Disposition: A | Discharge status: Home / Self Care | Payer: Medicaid Other | Attending: Emergency Medicine | Admitting: Emergency Medicine

## 2016-01-31 DIAGNOSIS — M79622 Pain in left upper arm: Secondary | ICD-10-CM | POA: Diagnosis present

## 2016-01-31 DIAGNOSIS — L02412 Cutaneous abscess of left axilla: Secondary | ICD-10-CM | POA: Insufficient documentation

## 2016-01-31 MED ORDER — TRAMADOL HCL 50 MG PO TABS: 50.0000 mg | ORAL_TABLET | Freq: Four times a day (QID) | ORAL | Status: DC | PRN

## 2016-01-31 MED ORDER — IBUPROFEN 800 MG PO TABS: 800.0000 mg | ORAL_TABLET | Freq: Three times a day (TID) | ORAL | Status: DC | PRN

## 2016-01-31 MED ORDER — LIDOCAINE HCL 2 % IJ SOLN
20.0000 mL | Freq: Once | INTRAMUSCULAR | Status: AC
  Administered 2016-01-31: 400 mg
  Filled 2016-01-31: qty 20

## 2016-01-31 NOTE — ED Notes (Signed)
Pt has abcess to left axilla. No drainage from site. Pt denies fever.

## 2016-01-31 NOTE — ED Notes (Signed)
Pt verbalized understanding of d/c instructions and has no further questions. Pt stable and NAD. Pt educated on prescription use and [facility] or here to have packing removed in 2 days

## 2016-01-31 NOTE — ED Provider Notes (Signed)
CSN: [id number]     Arrival date & time 01/31/16  1246 History  By signing my name below, I, Doreatha Martin, attest that this documentation has been prepared under the direction and in the presence of  [employer], PA-C. Electronically Signed: Doreatha Martin, ED Scribe. 01/31/2016. 1:45 PM.    Chief Complaint  Patient presents with  . Skin Problem   The history is provided by the patient. No language interpreter was used.   HPI Comments: Kaylee Kelly is a 30 y.o. female who presents to the Emergency Department complaining of a moderate, gradually worsening area of pain and swelling to the left axilla onset 4 days ago. Pt states pain is worsened with palpation. No relieving factors noted. Pt denies taking OTC medications at home to improve symptoms. She also denies fever, chills, drainage from the area.    Past Medical History  Diagnosis Date  . Anemia   . Headache    Past Surgical History  Procedure Laterality Date  . Cesarean section    . Appendectomy    . Cesarean section N/A 01/21/2015    Procedure: CESAREAN SECTION;  Surgeon: Tereso Newcomer, MD;  Location: [facility];  Service: Obstetrics;  Laterality: N/A;   Family History  Problem Relation Age of Onset  . Hyperlipidemia Father   . Diabetes Maternal Grandmother    Social History  Substance Use Topics  . Smoking status: Never Smoker   . Smokeless tobacco: Never Used  . Alcohol Use: Yes     Comment: socially; none now with preg    OB History    Gravida Para Term Preterm AB TAB SAB Ectopic Multiple Living   0 2     Review of Systems A complete 10 system review of systems was obtained and all systems are negative except as noted in the HPI and PMH.    Allergies  Review of patient's allergies indicates no known allergies.  Home Medications   Prior to Admission medications   Medication Sig Start Date End Date Taking? Authorizing Provider  Prenatal Multivit-Min-Fe-FA (PRENATAL VITAMINS) 0.8 MG tablet Take 1  tablet by mouth daily. 07/31/14   Aviva Signs, CNM   BP 98/59 mmHg  Pulse 80  Temp(Src) 98.3 F (36.8 C) (Oral)  Resp 16  SpO2 100% Physical Exam  Constitutional: She appears well-developed and well-nourished.  HENT:  Head: Normocephalic.  Eyes: Conjunctivae are normal.  Cardiovascular: Normal rate.   Pulmonary/Chest: Effort normal. No respiratory distress.  Abdominal: She exhibits no distension.  Musculoskeletal: Normal range of motion.  2 cm x 1 cm abscess to the left axilla. No drainage.   Neurological: She is alert.  Skin: Skin is warm and dry.  Psychiatric: She has a normal mood and affect. Her behavior is normal.  Nursing note and vitals reviewed.   ED Course  Procedures (including critical care time) DIAGNOSTIC STUDIES: Oxygen Saturation is 100% on RA, normal by my interpretation.    COORDINATION OF CARE: 1:42 PM Discussed treatment plan with pt at bedside which includes I&D and pt agreed to plan.  INCISION AND DRAINAGE PROCEDURE NOTE: Patient identification was confirmed and verbal consent was obtained. This procedure was performed by Ebbie Ridge, PA-C at 2:13 PM. Site: left axilla  Sterile procedures observed Needle size: 25 Anesthetic used (type and amt): 2 mL 2% lidocaine without epi  Blade size: 11 Drainage: moderate, purulent, bloody  Complexity: Complex Packing used: 1/4 in. Sterile iodoform gauze  Site anesthetized, incision made over site, wound drained and explored loculations, rinsed with copious amounts of normal saline, wound packed with sterile gauze, covered with dry, sterile dressing.  Pt tolerated procedure well without complications.  Instructions for care discussed verbally and pt provided with additional written instructions for homecare and f/u.   MDM   Final diagnoses:  None    Patient with skin abscess. Incision and drainage performed in the ED today.  Abscess was not large enough to warrant packing or drain placement. Wound  recheck in 2 days. Supportive care and return precautions discussed.  Pt sent home with Ultram. The patient appears reasonably screened and/or stabilized for discharge and I doubt any other emergent medical condition requiring further screening, evaluation, or treatment in the ED prior to discharge.   I personally performed the services described in this documentation, which was scribed in my presence. The recorded information has been reviewed and is accurate.   Charlestine NightChristopher Curtina Grills, PA-C 02/03/16 1032  Rolland PorterMark James, MD 02/11/16 [phone]

## 2016-01-31 NOTE — Discharge Instructions (Signed)
Return here as needed. Follow up in 2 days for packing removal.

## 2016-03-13 ENCOUNTER — Ambulatory Visit (HOSPITAL_COMMUNITY)
Admission: EM | Admit: 2016-03-13 | Discharge: 2016-03-13 | Disposition: A | Discharge status: Home / Self Care | Payer: Medicaid Other | Attending: Emergency Medicine | Admitting: Emergency Medicine

## 2016-03-13 ENCOUNTER — Encounter (HOSPITAL_COMMUNITY): Payer: Self-pay | Admitting: *Deleted

## 2016-03-13 DIAGNOSIS — Z9109 Other allergy status, other than to drugs and biological substances: Secondary | ICD-10-CM

## 2016-03-13 DIAGNOSIS — J309 Allergic rhinitis, unspecified: Secondary | ICD-10-CM

## 2016-03-13 DIAGNOSIS — Z91048 Other nonmedicinal substance allergy status: Secondary | ICD-10-CM

## 2016-03-13 MED ORDER — FLUTICASONE PROPIONATE 50 MCG/ACT NA SUSP: 2.0000 | Freq: Every day | NASAL | 0 refills | Status: DC

## 2016-03-13 MED ORDER — MOMETASONE FUROATE 50 MCG/ACT NA SUSP: 2.0000 | Freq: Every day | NASAL | 0 refills | Status: DC

## 2016-03-13 NOTE — ED Triage Notes (Signed)
Pt reports  Symptoms   Of     Watery  Eyes     Allergies      Pt   Reports   Symptoms  Not  releived   By  claritin         Child  Appears  In no  Acute  Distress

## 2016-03-13 NOTE — Discharge Instructions (Signed)
Try filling the Nasonex. If it is too expensive, then fill the Flonase. Start some Allegra or Zyrtec. If the nasal congestion is too much, then do Allegra-D or Zyrtec D. Start some saline nasal irrigation either with the neti pot or Lloyd Hugereil med sinus rinse. Follow directions on the box. Follow-up with ha primary care provider of your choice. See list below.  [facility] [phone] [address] [place] [id number]  [facility]: [address] [place] [id number]  [phone]  [facility]: [address] [place] [id number]   [phone]  [facility]: [address] [place] [address]  [phone]  [facility] : [address] [place] [id number] [phone]  [facility]: [address] [place] [phone]  [facility]: [address]  [place] [id number] [phone]  Dr. Oneal GroutMahima Pandey [address] [facility] [address] [facility] [place] [id number]  [phone]  Dr. Jackie PlumGeorge Osei-Bonsu, [facility]. [address] [place] [address]  [phone]

## 2016-03-13 NOTE — ED Provider Notes (Signed)
HPI  SUBJECTIVE:  Kaylee Kelly is a 30 y.o. female who presents with nasal congestion, copious clear rhinorrhea, watery, itchy eyes and sneezing up to 10-20 times per episode for the past 3 months. Symptoms are worse with housecleaning and being around fumes, no alleviating factors. She tried Claritin without improvement. She does state that she has a dog in the house. She does not take any medicines on a regular basis. she is not on any nasal steroids. She denies fevers, sore throat, chest pain, coughing, wheezing. She has no past medical history of allergies, asthma, diabetes, hypertension. Past medical history of anemia. LMP 1 years ago, she is postpartum. She is not breast-feeding. She denies possibility of being pregnant and states that that we do not need to check. PMD: None.    Past Medical History:  Diagnosis Date  . Anemia   . Headache     Past Surgical History:  Procedure Laterality Date  . APPENDECTOMY    . CESAREAN SECTION    . CESAREAN SECTION N/A 01/21/2015   Procedure: CESAREAN SECTION;  Surgeon: Tereso NewcomerUgonna A Anyanwu, MD;  Location: [facility];  Service: Obstetrics;  Laterality: N/A;    Family History  Problem Relation Age of Onset  . Hyperlipidemia Father   . Diabetes Maternal Grandmother     Social History  Substance Use Topics  . Smoking status: Never Smoker  . Smokeless tobacco: Never Used  . Alcohol use Yes     Comment: socially; none now with preg     No current facility-administered medications for this encounter.   Current Outpatient Prescriptions:  .  fluticasone (FLONASE) 50 MCG/ACT nasal spray, Place 2 sprays into both nostrils daily., Disp: 16 g, Rfl: 0 .  ibuprofen (ADVIL,MOTRIN) 800 MG tablet, Take 1 tablet (800 mg total) by mouth every 8 (eight) hours as needed., Disp: 21 tablet, Rfl: 0 .  mometasone (NASONEX) 50 MCG/ACT nasal spray, Place 2 sprays into the nose daily., Disp: 17 g, Rfl: 0 .  Prenatal Multivit-Min-Fe-FA (PRENATAL VITAMINS) 0.8 MG tablet,  Take 1 tablet by mouth daily., Disp: 30 tablet, Rfl: 12 .  traMADol (ULTRAM) 50 MG tablet, Take 1 tablet (50 mg total) by mouth every 6 (six) hours as needed for severe pain., Disp: 15 tablet, Rfl: 0  No Known Allergies   ROS  As noted in HPI.   Physical Exam  BP 101/68 (BP Location: Right Arm)   Pulse 72   Temp 98.2 F (36.8 C) (Oral)   Resp 16   SpO2 100%   Breastfeeding? No   Constitutional: Well developed, well nourished, no acute distress Eyes:  EOMI, conjunctiva normal bilaterally . no periorbital erythema, edema.   HENT: Normocephalic, atraumatic,mucus membranes moist .  Positive clear nasal congestion, slightly pale turbinates. No sinus tenderness. Tonsils normal, positive cobblestoning. Respiratory: Normal inspiratory effort Cardiovascular: Normal rate GI: nondistended skin: No rash, skin intact Musculoskeletal: no deformities Neurologic: Alert & oriented x 3, no focal neuro deficits Psychiatric: Speech and behavior appropriate   ED Course   Medications - No data to display  Orders Placed This Encounter  Procedures  . Visual acuity screening    Standing Status:   Standing    Number of Occurrences:   1    No results found for this or any previous visit (from the past 24 hour(s)). No results found.  ED Clinical Impression  Allergic rhinitis, unspecified allergic rhinitis type  Environmental allergies   ED Assessment/Plan  Presentation most consistent with allergic rhinitis/allergies. We  will have her try Allegra, Allegra-D, Zyrtec, or Zyrtec-D, Flonase or Nasonex, which ever one Medicaid covers, saline nasal irrigation. We'll refer her to primary care for routine care.  Discussed plan and followup with patient. Patient agrees with plan.   *This clinic note was created using Dragon dictation software. Therefore, there may be occasional mistakes despite careful proofreading.  ?   Domenick GongAshley Earlie Arciga, MD 03/13/16 1053

## 2016-08-18 ENCOUNTER — Encounter (HOSPITAL_COMMUNITY): Payer: Self-pay

## 2016-08-18 ENCOUNTER — Emergency Department (HOSPITAL_COMMUNITY)
Admission: EM | Admit: 2016-08-18 | Discharge: 2016-08-18 | Disposition: A | Discharge status: Home / Self Care | Payer: Medicaid Other | Attending: Emergency Medicine | Admitting: Emergency Medicine

## 2016-08-18 DIAGNOSIS — R112 Nausea with vomiting, unspecified: Secondary | ICD-10-CM

## 2016-08-18 DIAGNOSIS — R1013 Epigastric pain: Secondary | ICD-10-CM | POA: Diagnosis present

## 2016-08-18 DIAGNOSIS — R197 Diarrhea, unspecified: Secondary | ICD-10-CM | POA: Diagnosis not present

## 2016-08-18 LAB — URINALYSIS, ROUTINE W REFLEX MICROSCOPIC
Bilirubin Urine: NEGATIVE
Glucose, UA: NEGATIVE mg/dL
Hgb urine dipstick: NEGATIVE
Ketones, ur: NEGATIVE mg/dL
Leukocytes, UA: NEGATIVE
Nitrite: NEGATIVE
Protein, ur: NEGATIVE mg/dL
Specific Gravity, Urine: 1.032 — ABNORMAL HIGH (ref 1.005–1.030)
pH: 5 (ref 5.0–8.0)

## 2016-08-18 LAB — I-STAT BETA HCG BLOOD, ED (MC, WL, AP ONLY): I-stat hCG, quantitative: <5 m[IU]/mL (ref <5)

## 2016-08-18 LAB — COMPREHENSIVE METABOLIC PANEL
ALT: 13 U/L — ABNORMAL LOW (ref 14–54)
AST: 20 U/L (ref 15–41)
Albumin: 3.9 g/dL (ref 3.5–5.0)
Alkaline Phosphatase: 83 U/L (ref 38–126)
Anion gap: 8 (ref 5–15)
BUN: 8 mg/dL (ref 6–20)
CO2: 24 mmol/L (ref 22–32)
Calcium: 8.8 mg/dL — ABNORMAL LOW (ref 8.9–10.3)
Chloride: 107 mmol/L (ref 101–111)
Creatinine, Ser: 0.47 mg/dL (ref 0.44–1.00)
GFR calc Af Amer: >60 mL/min (ref >60)
GFR calc non Af Amer: >60 mL/min (ref >60)
Glucose, Bld: 111 mg/dL — ABNORMAL HIGH (ref 65–99)
Potassium: 3.8 mmol/L (ref 3.5–5.1)
Sodium: 139 mmol/L (ref 135–145)
Total Bilirubin: 2.2 mg/dL — ABNORMAL HIGH (ref 0.3–1.2)
Total Protein: 7.1 g/dL (ref 6.5–8.1)

## 2016-08-18 LAB — LIPASE, BLOOD: Lipase: 28 U/L (ref 11–51)

## 2016-08-18 LAB — CBC
HCT: 40.5 % (ref 36.0–46.0)
Hemoglobin: 14 g/dL (ref 12.0–15.0)
MCH: 30.6 pg (ref 26.0–34.0)
MCHC: 34.6 g/dL (ref 30.0–36.0)
MCV: 88.6 fL (ref 78.0–100.0)
Platelets: 233 10*3/uL (ref 150–400)
RBC: 4.57 MIL/uL (ref 3.87–5.11)
RDW: 12.8 % (ref 11.5–15.5)
WBC: 10.3 10*3/uL (ref 4.0–10.5)

## 2016-08-18 MED ORDER — ONDANSETRON 4 MG PO TBDP
4.0000 mg | ORAL_TABLET | Freq: Once | ORAL | Status: AC
  Administered 2016-08-18: 4 mg via ORAL
  Filled 2016-08-18: qty 1

## 2016-08-18 MED ORDER — OXYCODONE-ACETAMINOPHEN 5-325 MG PO TABS
2.0000 | ORAL_TABLET | Freq: Once | ORAL | Status: AC
  Administered 2016-08-18: 2 via ORAL
  Filled 2016-08-18: qty 2

## 2016-08-18 MED ORDER — LOPERAMIDE HCL 2 MG PO CAPS
2.0000 mg | ORAL_CAPSULE | Freq: Once | ORAL | Status: AC
  Administered 2016-08-18: 2 mg via ORAL
  Filled 2016-08-18: qty 1

## 2016-08-18 MED ORDER — DICYCLOMINE HCL 10 MG/ML IM SOLN
20.0000 mg | Freq: Once | INTRAMUSCULAR | Status: AC
  Administered 2016-08-18: 20 mg via INTRAMUSCULAR
  Filled 2016-08-18: qty 2

## 2016-08-18 NOTE — ED Provider Notes (Signed)
[facility]Y DEPT Provider Note   CSN: [id number] Arrival date & time: 08/18/16  1456   By signing my name below, I, [name], attest that this documentation has been prepared under the direction and in the presence of Raeford Razor, MD . Electronically Signed: [name], Scribe. 08/18/2016. 6:31 PM.   History   Chief Complaint Chief Complaint  Patient presents with  . Abdominal Pain  . Emesis     The history is provided by the patient. No language interpreter was used.    HPI Comments:  Kaylee Kelly is a 31 y.o. female who presents to the Emergency Department complaining of epigastric abdominal pain which began yesterday. Pt notes she woke up with a HA this AM and took advil. Shortly afterward she began vomiting. She also reports associated diarrhea today. No blood in her stool or vomit. She reports h/o similar pain 5-6 years ago; was told it was due to her gallbladder but she did not have a cholecystectomy at that time. She denies fever, chills, and urinary symptoms. Pt notes her son has recently had  vomiting and diarrhea.  Past Medical History:  Diagnosis Date  . Anemia   . Headache     There are no active problems to display for this patient.   Past Surgical History:  Procedure Laterality Date  . APPENDECTOMY    . CESAREAN SECTION    . CESAREAN SECTION N/A 01/21/2015   Procedure: CESAREAN SECTION;  Surgeon: Tereso Newcomer, MD;  Location: [facility];  Service: Obstetrics;  Laterality: N/A;    OB History    Gravida Para Term Preterm AB Living   2 2 2     2    SAB TAB Ectopic Multiple Live Births         0 2       Home Medications    Prior to Admission medications   Medication Sig Start Date End Date Taking? Authorizing Provider  fluticasone (FLONASE) 50 MCG/ACT nasal spray Place 2 sprays into both nostrils daily. 03/13/16   Domenick Gong, MD  ibuprofen (ADVIL,MOTRIN) 800 MG tablet Take 1 tablet (800 mg total) by mouth every 8 (eight) hours as needed.  01/31/16   Christopher Lawyer, PA-C  mometasone (NASONEX) 50 MCG/ACT nasal spray Place 2 sprays into the nose daily. 03/13/16   Domenick Gong, MD  Prenatal Multivit-Min-Fe-FA (PRENATAL VITAMINS) 0.8 MG tablet Take 1 tablet by mouth daily. 07/31/14   Aviva Signs, CNM  traMADol (ULTRAM) 50 MG tablet Take 1 tablet (50 mg total) by mouth every 6 (six) hours as needed for severe pain. 01/31/16   Charlestine Night, PA-C    Family History Family History  Problem Relation Age of Onset  . Hyperlipidemia Father   . Diabetes Maternal Grandmother     Social History Social History  Substance Use Topics  . Smoking status: Never Smoker  . Smokeless tobacco: Never Used  . Alcohol use Yes     Comment: socially; none now with preg      Allergies   Patient has no known allergies.   Review of Systems Review of Systems  Constitutional: Negative for fever.  Gastrointestinal: Positive for abdominal pain, diarrhea, nausea and vomiting.  Neurological: Positive for headaches. Negative for weakness.  All other systems reviewed and are negative.    Physical Exam Updated Vital Signs BP 101/61 (BP Location: Left Arm)   Pulse 80   Temp 98.7 F (37.1 C) (Oral)   Resp 15   SpO2 98%  Physical Exam  Constitutional: She is oriented to person, place, and time. She appears well-developed and well-nourished. No distress.  HENT:  Head: Normocephalic and atraumatic.  Eyes: EOM are normal.  Neck: Normal range of motion.  Cardiovascular: Normal rate, regular rhythm and normal heart sounds.   Pulmonary/Chest: Effort normal and breath sounds normal.  Abdominal: Soft. She exhibits no distension. There is tenderness (diffuse abdominal tenderness worse in epigastrium ). There is no rebound and no guarding.  Musculoskeletal: Normal range of motion.  Neurological: She is alert and oriented to person, place, and time.  Skin: Skin is warm and dry.  Psychiatric: She has a normal mood and affect. Judgment  normal.  Nursing note and vitals reviewed.    ED Treatments / Results  DIAGNOSTIC STUDIES:  Oxygen Saturation is 98% on RA, normal by my interpretation.    COORDINATION OF CARE:  6:27 PM Discussed treatment plan with pt at bedside and pt agreed to plan.    Labs (all labs ordered are listed, but only abnormal results are displayed) Labs Reviewed  COMPREHENSIVE METABOLIC PANEL - Abnormal; Notable for the following:       Result Value   Glucose, Bld 111 (*)    Calcium 8.8 (*)    ALT 13 (*)    Total Bilirubin 2.2 (*)    All other components within normal limits  URINALYSIS, ROUTINE W REFLEX MICROSCOPIC - Abnormal; Notable for the following:    APPearance TURBID (*)    Specific Gravity, Urine 1.032 (*)    Bacteria, UA RARE (*)    Squamous Epithelial / LPF 6-30 (*)    All other components within normal limits  LIPASE, BLOOD  CBC  I-STAT BETA HCG BLOOD, ED ([facility], [facility], [facility] ONLY)    EKG  EKG Interpretation None       Radiology No results found.  Procedures Procedures (including critical care time)  Medications Ordered in ED Medications - No data to display   Initial Impression / Assessment and Plan / ED Course  I have reviewed the triage vital signs and the nursing notes.  Pertinent labs & imaging results that were available during my care of the patient were reviewed by me and considered in my medical decision making (see chart for details).      Final Clinical Impressions(s) / ED Diagnoses   Final diagnoses:  Nausea vomiting and diarrhea    New Prescriptions New Prescriptions   No medications on file   I personally preformed the services scribed in my presence. The recorded information has been reviewed is accurate. Raeford RazorStephen Yalanda Soderman, MD.     Raeford RazorStephen Thatcher Doberstein, MD 08/27/16 [phone]

## 2016-08-18 NOTE — ED Triage Notes (Signed)
Patient here with generalized abdominal pain with vomiting and diarrhea since last  Night. States that the vomiting has stopped, NAD

## 2017-09-17 ENCOUNTER — Encounter: Payer: Self-pay | Admitting: *Deleted

## 2017-12-24 ENCOUNTER — Encounter (HOSPITAL_COMMUNITY): Payer: Self-pay | Admitting: Emergency Medicine

## 2017-12-24 ENCOUNTER — Emergency Department (HOSPITAL_COMMUNITY)
Admission: EM | Admit: 2017-12-24 | Discharge: 2017-12-25 | Disposition: A | Discharge status: Home / Self Care | Payer: Medicaid Other | Attending: Emergency Medicine | Admitting: Emergency Medicine

## 2017-12-24 ENCOUNTER — Other Ambulatory Visit: Payer: Self-pay

## 2017-12-24 ENCOUNTER — Emergency Department (HOSPITAL_COMMUNITY): Payer: Medicaid Other

## 2017-12-24 DIAGNOSIS — J029 Acute pharyngitis, unspecified: Secondary | ICD-10-CM | POA: Diagnosis present

## 2017-12-24 DIAGNOSIS — J020 Streptococcal pharyngitis: Secondary | ICD-10-CM | POA: Insufficient documentation

## 2017-12-24 LAB — BASIC METABOLIC PANEL
Anion gap: 10 (ref 5–15)
BUN: 6 mg/dL (ref 6–20)
CHLORIDE: 106 mmol/L (ref 101–111)
CO2: 25 mmol/L (ref 22–32)
Calcium: 8.5 mg/dL — ABNORMAL LOW (ref 8.9–10.3)
Creatinine, Ser: 0.43 mg/dL — ABNORMAL LOW (ref 0.44–1.00)
GFR calc Af Amer: >60 mL/min (ref >60)
GFR calc non Af Amer: >60 mL/min (ref >60)
GLUCOSE: 84 mg/dL (ref 65–99)
POTASSIUM: 3.4 mmol/L — AB (ref 3.5–5.1)
Sodium: 141 mmol/L (ref 135–145)

## 2017-12-24 LAB — CBC
HEMATOCRIT: 36.6 % (ref 36.0–46.0)
HEMOGLOBIN: 12.1 g/dL (ref 12.0–15.0)
MCH: 29.4 pg (ref 26.0–34.0)
MCHC: 33.1 g/dL (ref 30.0–36.0)
MCV: 89.1 fL (ref 78.0–100.0)
PLATELETS: 201 10*3/uL (ref 150–400)
RBC: 4.11 MIL/uL (ref 3.87–5.11)
RDW: 12.5 % (ref 11.5–15.5)
WBC: 8.8 10*3/uL (ref 4.0–10.5)

## 2017-12-24 LAB — GROUP A STREP BY PCR: Group A Strep by PCR: DETECTED — AB

## 2017-12-24 NOTE — ED Triage Notes (Signed)
Pt reports sore throat, HA, nasal congestion, fatigue, gen weakness X2-3 months. Pt tonsils noted to be red and swollen, pt states hx of anemia and is concerned her hgb may be low.

## 2017-12-25 MED ORDER — PHENOL 1.4 % MT LIQD
1.0000 | OROMUCOSAL | 0 refills | Status: DC | PRN
Start: 2017-12-25 — End: 2020-03-19

## 2017-12-25 MED ORDER — IBUPROFEN 800 MG PO TABS: 800.0000 mg | ORAL_TABLET | Freq: Three times a day (TID) | ORAL | 0 refills | Status: DC | PRN

## 2017-12-25 MED ORDER — PENICILLIN G BENZATHINE 1200000 UNIT/2ML IM SUSP
1.2000 10*6.[IU] | Freq: Once | INTRAMUSCULAR | Status: AC
  Administered 2017-12-25: 1.2 10*6.[IU] via INTRAMUSCULAR
  Filled 2017-12-25: qty 2

## 2017-12-25 NOTE — ED Notes (Addendum)
E-signature not available, prt verbalized understanding of DC instructions and presscriptions

## 2017-12-25 NOTE — ED Provider Notes (Signed)
[facility] Provider Note   CSN: [id number] Arrival date & time: 12/24/17  2002    History   Chief Complaint Chief Complaint  Patient presents with  . Sore Throat  . Nasal Congestion    HPI Kaylee Kelly is a 32 y.o. female.  32 year old female presents to the emergency department for evaluation of upper respiratory symptoms including sore throat, nasal congestion, fatigue.  She has also experienced postnasal drip.  She specifically notes worsening of her sore throat 1 week ago.  It is aggravated with swallowing, though she has been able to continue drinking fluids.  She denies any known fever or sick contacts.  No difficulty breathing, vomiting, diarrhea.  She has been using over-the-counter remedies including Flonase, ibuprofen with little relief.  She also expresses concern that her hemoglobin may be low; patient reports history of anemia.     Past Medical History:  Diagnosis Date  . Anemia   . Headache     There are no active problems to display for this patient.   Past Surgical History:  Procedure Laterality Date  . APPENDECTOMY    . CESAREAN SECTION    . CESAREAN SECTION N/A 01/21/2015   Procedure: CESAREAN SECTION;  Surgeon: Tereso NewcomerUgonna A Anyanwu, MD;  Location: [facility];  Service: Obstetrics;  Laterality: N/A;     OB History    Gravida  2   Para  2   Term  2   Preterm      AB      Living  2     SAB      TAB      Ectopic      Multiple  0   Live Births  2            Home Medications    Prior to Admission medications   Medication Sig Start Date End Date Taking? Authorizing Provider  fluticasone (FLONASE) 50 MCG/ACT nasal spray Place 2 sprays into both nostrils daily. 03/13/16   Domenick GongMortenson, Ashley, MD  ibuprofen (ADVIL,MOTRIN) 800 MG tablet Take 1 tablet (800 mg total) by mouth every 8 (eight) hours as needed. 12/25/17   Antony MaduraHumes, Bobbye Reinitz, PA-C  mometasone (NASONEX) 50 MCG/ACT nasal spray Place 2 sprays into the nose  daily. 03/13/16   Domenick GongMortenson, Ashley, MD  phenol (CHLORASEPTIC) 1.4 % LIQD Use as directed 1 spray in the mouth or throat as needed for throat irritation / pain. 12/25/17   Antony MaduraHumes, Cristianna Cyr, PA-C  Prenatal Multivit-Min-Fe-FA (PRENATAL VITAMINS) 0.8 MG tablet Take 1 tablet by mouth daily. 07/31/14   Aviva SignsWilliams, Marie L, CNM  traMADol (ULTRAM) 50 MG tablet Take 1 tablet (50 mg total) by mouth every 6 (six) hours as needed for severe pain. 01/31/16   Charlestine NightLawyer, Christopher, PA-C    Family History Family History  Problem Relation Age of Onset  . Hyperlipidemia Father   . Diabetes Maternal Grandmother     Social History Social History   Tobacco Use  . Smoking status: Never Smoker  . Smokeless tobacco: Never Used  Substance Use Topics  . Alcohol use: Yes    Comment: socially; none now with preg   . Drug use: No     Allergies   Patient has no known allergies.   Review of Systems Review of Systems Ten systems reviewed and are negative for acute change, except as noted in the HPI.    Physical Exam Updated Vital Signs BP 98/69   Pulse 67   Temp 98.3 F (  36.8 C) (Oral)   Resp 20   Ht 5\' 4"  (1.626 m)   Wt 63.5 kg (140 lb)   SpO2 100%   BMI 24.03 kg/m   Physical Exam  Constitutional: She is oriented to person, place, and time. She appears well-developed and well-nourished. No distress.  Nontoxic appearing in no distress  HENT:  Head: Normocephalic and atraumatic.  Mouth/Throat: Oropharynx is clear and moist.  Posterior oropharyngeal erythema without exudates.  There is tonsillar enlargement.  Uvula midline.  Patient tolerating secretions without difficulty.  No tripoding or stridor.  Eyes: Conjunctivae and EOM are normal. No scleral icterus.  Neck: Normal range of motion.  No meningismus  Cardiovascular: Normal rate, regular rhythm and intact distal pulses.  Pulmonary/Chest: Effort normal. No stridor. No respiratory distress. She has no wheezes. She has no rales.  Respirations even  and unlabored.  Lungs clear to auscultation bilaterally.  Musculoskeletal: Normal range of motion.  Lymphadenopathy:    She has cervical adenopathy (mild, tonsillar).  Neurological: She is alert and oriented to person, place, and time. She exhibits normal muscle tone. Coordination normal.  Skin: Skin is warm and dry. No rash noted. She is not diaphoretic. No erythema. No pallor.  Psychiatric: She has a normal mood and affect. Her behavior is normal.  Nursing note and vitals reviewed.    ED Treatments / Results  Labs (all labs ordered are listed, but only abnormal results are displayed) Labs Reviewed  GROUP A STREP BY PCR - Abnormal; Notable for the following components:      Result Value   Group A Strep by PCR DETECTED (*)    All other components within normal limits  BASIC METABOLIC PANEL - Abnormal; Notable for the following components:   Potassium 3.4 (*)    Creatinine, Ser 0.43 (*)    Calcium 8.5 (*)    All other components within normal limits  CBC    EKG None  Radiology Dg Chest 2 View  Result Date: 12/24/2017 CLINICAL DATA:  Initial evaluation for acute congestion. EXAM: CHEST - 2 VIEW COMPARISON:  None. FINDINGS: The cardiac and mediastinal silhouettes are within normal limits. The lungs are normally inflated. No airspace consolidation, pleural effusion, or pulmonary edema is identified. There is no pneumothorax. No acute osseous abnormality identified. IMPRESSION: No active cardiopulmonary disease. Electronically Signed   By: Rise Mu M.D.   On: 12/24/2017 21:11    Procedures Procedures (including critical care time)  Medications Ordered in ED Medications  penicillin g benzathine (BICILLIN LA) 1200000 UNIT/2ML injection 1.2 Million Units (has no administration in time range)     Initial Impression / Assessment and Plan / ED Course  I have reviewed the triage vital signs and the nursing notes.  Pertinent labs & imaging results that were available  during my care of the patient were reviewed by me and considered in my medical decision making (see chart for details).     Patient presenting for upper respiratory symptoms x 2 months with worsening sore throat x1 week.  She is nontoxic-appearing today and afebrile.  Vital signs stable.  Work-up initiated in triage.  It is generally reassuring.  The patient was found to be positive for strep throat.  This is likely the cause of her worsening dysphasia.  No nuchal rigidity or meningismus.  No tripoding or stridor.  Tolerating secretions.  Patient given shot of Bicillin IM.  Will continue with outpatient supportive management.  Encouraged primary care follow up to ensure symptoms resolve.  Return precautions discussed and provided.  Patient discharged in stable condition with no unaddressed concerns.  Vitals:   12/24/17 2037 12/24/17 2326  BP: 104/63 98/69  Pulse: 73 67  Resp: 18 20  Temp: 98.5 F (36.9 C) 98.3 F (36.8 C)  TempSrc: Oral Oral  SpO2: 100% 100%  Weight: 63.5 kg (140 lb)   Height: 5\' 4"  (1.626 m)     Final Clinical Impressions(s) / ED Diagnoses   Final diagnoses:  Strep pharyngitis    ED Discharge Orders        Ordered    ibuprofen (ADVIL,MOTRIN) 800 MG tablet  Every 8 hours PRN     12/25/17 0042    phenol (CHLORASEPTIC) 1.4 % LIQD  As needed     12/25/17 0042       Antony Madura, PA-C 12/25/17 0051    Nira Conn, MD 12/25/17 [phone]

## 2017-12-25 NOTE — Discharge Instructions (Signed)
You were found to have strep throat today.  We recommend that you continue with ibuprofen for management of pain.  You may supplement with the use of Chloraseptic spray.  Drink plenty of warm liquids as this will be soothing to your throat.  Gargle with salt water 2-3 times per day.  Follow-up with a primary care doctor to ensure resolution of symptoms.

## 2018-01-03 ENCOUNTER — Encounter (HOSPITAL_COMMUNITY): Payer: Self-pay | Admitting: Emergency Medicine

## 2018-01-03 ENCOUNTER — Emergency Department (HOSPITAL_COMMUNITY)
Admission: EM | Admit: 2018-01-03 | Discharge: 2018-01-03 | Disposition: A | Discharge status: Home / Self Care | Payer: Medicaid Other | Attending: Emergency Medicine | Admitting: Emergency Medicine

## 2018-01-03 DIAGNOSIS — Y929 Unspecified place or not applicable: Secondary | ICD-10-CM | POA: Diagnosis not present

## 2018-01-03 DIAGNOSIS — S80862A Insect bite (nonvenomous), left lower leg, initial encounter: Secondary | ICD-10-CM | POA: Diagnosis not present

## 2018-01-03 DIAGNOSIS — Y999 Unspecified external cause status: Secondary | ICD-10-CM | POA: Diagnosis not present

## 2018-01-03 DIAGNOSIS — T7840XA Allergy, unspecified, initial encounter: Secondary | ICD-10-CM | POA: Diagnosis not present

## 2018-01-03 DIAGNOSIS — R21 Rash and other nonspecific skin eruption: Secondary | ICD-10-CM | POA: Diagnosis present

## 2018-01-03 DIAGNOSIS — W57XXXA Bitten or stung by nonvenomous insect and other nonvenomous arthropods, initial encounter: Secondary | ICD-10-CM

## 2018-01-03 DIAGNOSIS — X58XXXA Exposure to other specified factors, initial encounter: Secondary | ICD-10-CM | POA: Diagnosis not present

## 2018-01-03 DIAGNOSIS — Y939 Activity, unspecified: Secondary | ICD-10-CM | POA: Insufficient documentation

## 2018-01-03 DIAGNOSIS — T63441A Toxic effect of venom of bees, accidental (unintentional), initial encounter: Secondary | ICD-10-CM | POA: Diagnosis not present

## 2018-01-03 LAB — CBC WITH DIFFERENTIAL/PLATELET
ABS IMMATURE GRANULOCYTES: 0.1 10*3/uL (ref 0.0–0.1)
BASOS ABS: 0 10*3/uL (ref 0.0–0.1)
Basophils Relative: 0 %
Eosinophils Absolute: 0.3 10*3/uL (ref 0.0–0.7)
Eosinophils Relative: 2 %
HCT: 49.4 % — ABNORMAL HIGH (ref 36.0–46.0)
HEMOGLOBIN: 16.3 g/dL — AB (ref 12.0–15.0)
IMMATURE GRANULOCYTES: 0 %
LYMPHS PCT: 35 %
Lymphs Abs: 4.3 10*3/uL — ABNORMAL HIGH (ref 0.7–4.0)
MCH: 28.9 pg (ref 26.0–34.0)
MCHC: 33 g/dL (ref 30.0–36.0)
MCV: 87.6 fL (ref 78.0–100.0)
MONO ABS: 0.8 10*3/uL (ref 0.1–1.0)
Monocytes Relative: 7 %
NEUTROS ABS: 6.9 10*3/uL (ref 1.7–7.7)
NEUTROS PCT: 56 %
Platelets: 362 10*3/uL (ref 150–400)
RBC: 5.64 MIL/uL — ABNORMAL HIGH (ref 3.87–5.11)
RDW: 12.2 % (ref 11.5–15.5)
WBC: 12.4 10*3/uL — ABNORMAL HIGH (ref 4.0–10.5)

## 2018-01-03 LAB — BASIC METABOLIC PANEL
ANION GAP: 13 (ref 5–15)
BUN: 8 mg/dL (ref 6–20)
CHLORIDE: 107 mmol/L (ref 101–111)
CO2: 18 mmol/L — ABNORMAL LOW (ref 22–32)
Calcium: 8.7 mg/dL — ABNORMAL LOW (ref 8.9–10.3)
Creatinine, Ser: 0.74 mg/dL (ref 0.44–1.00)
Glucose, Bld: 173 mg/dL — ABNORMAL HIGH (ref 65–99)
Potassium: 4.6 mmol/L (ref 3.5–5.1)
SODIUM: 138 mmol/L (ref 135–145)

## 2018-01-03 LAB — I-STAT BETA HCG BLOOD, ED (MC, WL, AP ONLY)

## 2018-01-03 MED ORDER — EPINEPHRINE 0.3 MG/0.3ML IJ SOAJ
INTRAMUSCULAR | Status: AC
  Administered 2018-01-03: 12:00:00
  Filled 2018-01-03: qty 0.3

## 2018-01-03 MED ORDER — EPINEPHRINE 0.3 MG/0.3ML IJ SOAJ: 0.3000 mg | Freq: Once | INTRAMUSCULAR | 0 refills | Status: AC

## 2018-01-03 MED ORDER — DIPHENHYDRAMINE HCL 50 MG/ML IJ SOLN
25.0000 mg | Freq: Once | INTRAMUSCULAR | Status: AC
  Administered 2018-01-03: 25 mg via INTRAVENOUS
  Filled 2018-01-03: qty 1

## 2018-01-03 MED ORDER — PREDNISONE 20 MG PO TABS: 40.0000 mg | ORAL_TABLET | Freq: Every day | ORAL | 0 refills | Status: AC

## 2018-01-03 MED ORDER — FAMOTIDINE IN NACL 20-0.9 MG/50ML-% IV SOLN
20.0000 mg | Freq: Once | INTRAVENOUS | Status: AC
  Administered 2018-01-03: 20 mg via INTRAVENOUS
  Filled 2018-01-03: qty 50

## 2018-01-03 MED ORDER — SODIUM CHLORIDE 0.9 % IV BOLUS
1000.0000 mL | Freq: Once | INTRAVENOUS | Status: AC
  Administered 2018-01-03: 1000 mL via INTRAVENOUS

## 2018-01-03 MED ORDER — FAMOTIDINE 20 MG PO TABS: 20.0000 mg | ORAL_TABLET | Freq: Two times a day (BID) | ORAL | 0 refills | Status: DC

## 2018-01-03 MED ORDER — DIPHENHYDRAMINE HCL 25 MG PO TABS: 25.0000 mg | ORAL_TABLET | Freq: Four times a day (QID) | ORAL | 0 refills | Status: DC

## 2018-01-03 MED ORDER — METHYLPREDNISOLONE SODIUM SUCC 125 MG IJ SOLR
125.0000 mg | Freq: Once | INTRAMUSCULAR | Status: AC
  Administered 2018-01-03: 125 mg via INTRAVENOUS
  Filled 2018-01-03: qty 2

## 2018-01-03 NOTE — ED Triage Notes (Signed)
Pt arrives after being stung by a yellow jacket 1-2 hours ago, with hives to entire body.

## 2018-01-03 NOTE — Discharge Instructions (Signed)
Take Benadryl and prednisone as directed.  As we discussed, you have been prescribed an EpiPen.  Please use this pen anytime you get stung by a yellowjacket or be and you feel symptoms.  After you use it, r come to the emergency department immediately.  Follow-up with referred [facility] for primary care establishment.  Return to the emergency department for any difficulty breathing, feeling of your throat or tongue or swollen, difficulty swallowing her saliva, difficulty swallowing drinks, vomiting, return of rash, abdominal pain, chest pain or any other worsening or concerning symptoms.

## 2018-01-03 NOTE — ED Notes (Signed)
Gave pt juice and crackers, tolerating well at this time.

## 2018-01-03 NOTE — ED Notes (Signed)
Pt stable, ambulatory, and verbalizes understanding of d/c instructions.  

## 2018-01-03 NOTE — ED Notes (Signed)
Pt aware we need urine for a specimen.  

## 2018-01-03 NOTE — ED Provider Notes (Signed)
[facility] EMERGENCY DEPARTMENT Provider Note   CSN: [id number] Arrival date & time: 01/03/18  1158     History   Chief Complaint Chief Complaint  Patient presents with  . Allergic Reaction    HPI Kaylee Kelly is a 32 y.o. female who presents for evaluation of facial swelling, hives related to possible allergic reaction.  Patient reports that she was outside with her family when she got stung by a yellow jacket.  Patient reports that 2 to 3 minutes after being stung, she started breaking out in diffuse hives all over her body.  Additionally, patient felt like she was having swelling in her eyes which was causing her to have some blurry vision.  Patient also felt like she was having some swelling of her tongue and lips but states she is not having any difficulty breathing.  Patient states she did not take any medications prior to ED arrival.  Patient reports no previous history of allergic reactions and has no known allergens.  She denies any recent new soaps, detergents, lotions, exposures.  Denies any new medications.  Reports the only previous rash that she had was after poison ivy.  Denies any exposure to poison ivy at this time.  Patient denies any chest pain, abdominal pain, vomiting, difficulty breathing.  The history is provided by the patient.    Past Medical History:  Diagnosis Date  . Anemia   . Headache     There are no active problems to display for this patient.   Past Surgical History:  Procedure Laterality Date  . APPENDECTOMY    . CESAREAN SECTION    . CESAREAN SECTION N/A 01/21/2015   Procedure: CESAREAN SECTION;  Surgeon: Tereso Newcomer, MD;  Location: [facility];  Service: Obstetrics;  Laterality: N/A;     OB History    Gravida  2   Para  2   Term  2   Preterm      AB      Living  2     SAB      TAB      Ectopic      Multiple  0   Live Births  2            Home Medications    Prior to Admission medications     Medication Sig Start Date End Date Taking? Authorizing Provider  diphenhydrAMINE (BENADRYL) 25 MG tablet Take 1 tablet (25 mg total) by mouth every 6 (six) hours for 4 days. 01/03/18 01/07/18  Maxwell Caul, PA-C  EPINEPHrine (EPIPEN 2-PAK) 0.3 mg/0.3 mL IJ SOAJ injection Inject 0.3 mLs (0.3 mg total) into the muscle once for 1 dose. 01/03/18 01/03/18  Maxwell Caul, PA-C  famotidine (PEPCID) 20 MG tablet Take 1 tablet (20 mg total) by mouth 2 (two) times daily for 4 days. 01/03/18 01/07/18  Maxwell Caul, PA-C  fluticasone (FLONASE) 50 MCG/ACT nasal spray Place 2 sprays into both nostrils daily. Patient not taking: Reported on 01/03/2018 03/13/16   Domenick Gong, MD  ibuprofen (ADVIL,MOTRIN) 800 MG tablet Take 1 tablet (800 mg total) by mouth every 8 (eight) hours as needed. Patient not taking: Reported on 01/03/2018 12/25/17   Antony Madura, PA-C  mometasone (NASONEX) 50 MCG/ACT nasal spray Place 2 sprays into the nose daily. Patient not taking: Reported on 01/03/2018 03/13/16   Domenick Gong, MD  phenol (CHLORASEPTIC) 1.4 % LIQD Use as directed 1 spray in the mouth or throat as needed for  throat irritation / pain. Patient not taking: Reported on 01/03/2018 12/25/17   Antony Madura, PA-C  predniSONE (DELTASONE) 20 MG tablet Take 2 tablets (40 mg total) by mouth daily for 4 days. 01/03/18 01/07/18  Maxwell Caul, PA-C  Prenatal Multivit-Min-Fe-FA (PRENATAL VITAMINS) 0.8 MG tablet Take 1 tablet by mouth daily. Patient not taking: Reported on 01/03/2018 07/31/14   Aviva Signs, CNM  traMADol (ULTRAM) 50 MG tablet Take 1 tablet (50 mg total) by mouth every 6 (six) hours as needed for severe pain. Patient not taking: Reported on 01/03/2018 01/31/16   Charlestine Night, PA-C    Family History Family History  Problem Relation Age of Onset  . Hyperlipidemia Father   . Diabetes Maternal Grandmother     Social History Social History   Tobacco Use  . Smoking status: Never Smoker  .  Smokeless tobacco: Never Used  Substance Use Topics  . Alcohol use: Yes    Comment: socially; none now with preg   . Drug use: No     Allergies   Patient has no known allergies.   Review of Systems Review of Systems  Constitutional: Negative for fever.  HENT: Positive for facial swelling.   Eyes: Positive for visual disturbance (secondary to eyelid swelling).  Respiratory: Positive for shortness of breath. Negative for cough.   Cardiovascular: Negative for chest pain.  Gastrointestinal: Negative for abdominal pain, nausea and vomiting.  Genitourinary: Negative for dysuria and hematuria.  Skin: Positive for rash.  Neurological: Negative for headaches.  All other systems reviewed and are negative.    Physical Exam Updated Vital Signs BP 103/65   Pulse 83   Resp 17   SpO2 99%   Physical Exam  Constitutional: She is oriented to person, place, and time. She appears well-developed and well-nourished.  HENT:  Head: Normocephalic and atraumatic.  Mouth/Throat: Oropharynx is clear and moist and mucous membranes are normal.  Appears uncomfortable but no acute distress.  Airways patent, phonation is intact.  No evidence of oral angioedema, the patient feels like her tongue and lips are swollen.  Swelling around bilateral eyes and upper face.  Eyes: Pupils are equal, round, and reactive to light. Conjunctivae, EOM and lids are normal.  Neck: Full passive range of motion without pain.  Cardiovascular: Normal rate, regular rhythm, normal heart sounds and normal pulses. Exam reveals no gallop and no friction rub.  No murmur heard. Pulmonary/Chest: Effort normal and breath sounds normal.  Lungs clear to auscultation bilaterally.  Symmetric chest rise.  No wheezing, rales, rhonchi.  Abdominal: Soft. Normal appearance. There is no tenderness. There is no rigidity and no guarding.  Musculoskeletal: Normal range of motion.  Neurological: She is alert and oriented to person, place, and  time.  Skin: Skin is warm and dry. Capillary refill takes less than 2 seconds. Rash noted. Rash is urticarial.  Diffuse urticaria noted to face, chest and abdomen, back, bilateral upper and lower extremities. Small insect wound noted to the posterior left calf.   Psychiatric: She has a normal mood and affect. Her speech is normal.  Nursing note and vitals reviewed.    ED Treatments / Results  Labs (all labs ordered are listed, but only abnormal results are displayed) Labs Reviewed  CBC WITH DIFFERENTIAL/PLATELET - Abnormal; Notable for the following components:      Result Value   WBC 12.4 (*)    RBC 5.64 (*)    Hemoglobin 16.3 (*)    HCT 49.4 (*)  Lymphs Abs 4.3 (*)    All other components within normal limits  BASIC METABOLIC PANEL - Abnormal; Notable for the following components:   CO2 18 (*)    Glucose, Bld 173 (*)    Calcium 8.7 (*)    All other components within normal limits  I-STAT BETA HCG BLOOD, ED ([facility], [facility], [facility] ONLY)    EKG None  Radiology No results found.  Procedures .Critical Care Performed by: Maxwell Caul, PA-C Authorized by: Maxwell Caul, PA-C   Critical care provider statement:    Critical care time (minutes):  35   Critical care was necessary to treat or prevent imminent or life-threatening deterioration of the following conditions:  Cardiac failure, renal failure, respiratory failure, circulatory failure, shock, CNS failure or compromise and metabolic crisis   Critical care was time spent personally by me on the following activities:  Blood draw for specimens, ordering and performing treatments and interventions, ordering and review of laboratory studies, development of treatment plan with patient or surrogate, ordering and review of radiographic studies, re-evaluation of patient's condition, evaluation of patient's response to treatment and examination of patient   (including critical care time)  Medications Ordered in ED Medications    EPINEPHrine (EPI-PEN) 0.3 mg/0.3 mL injection (  Given 01/03/18 1208)  diphenhydrAMINE (BENADRYL) injection 25 mg (25 mg Intravenous Given 01/03/18 1220)  sodium chloride 0.9 % bolus 1,000 mL (0 mLs Intravenous Stopped 01/03/18 1350)  methylPREDNISolone sodium succinate (SOLU-MEDROL) 125 mg/2 mL injection 125 mg (125 mg Intravenous Given 01/03/18 1220)  famotidine (PEPCID) IVPB 20 mg premix (0 mg Intravenous Stopped 01/03/18 1350)  sodium chloride 0.9 % bolus 1,000 mL (0 mLs Intravenous Stopped 01/03/18 1608)     Initial Impression / Assessment and Plan / ED Course  I have reviewed the triage vital signs and the nursing notes.  Pertinent labs & imaging results that were available during my care of the patient were reviewed by me and considered in my medical decision making (see chart for details).     32 year old female who presents for possible allergic reaction.  Patient reports that she was outside when she got stung by yellow jacket.  Reports approximately minute after being stung, she broke out in hives and felt like her face, tongue and lips were swelling.  Reports some difficulty breathing secondary to symptoms.  Additionally, patient reports blurry vision secondary to eyelid swelling.  No known history of allergies.  Never had a reaction before. Patient is afebrile, non-toxic appearing, sitting comfortably on examination table. Vital signs reviewed and stable.  I was called to evaluate patient from triage.  On my initial evaluation patient had diffuse urticaria to her entire body.  Additionally, patient had facial swelling, most notably around the periorbital region bilaterally.  While I did not notice any obvious oral angioedema on my exam, patient felt like she was having swelling of her lip and tongue and felt like her throat was closing.  No obvious evidence of respiratory distress.  Given concern of facial swelling and extensive rash and complaints from patient, EpiPen was given.   Additionally, will give Benadryl, Solu-Medrol, Pepcid for continued treatment of reaction.  Basic labs ordered.  Reevaluation after EpiPen.  Facial swelling has improved significantly.  Patient still with diffuse rash but she reports that she feels better.  Feels like she is having improvement in breathing and vision.  Reevaluation.  Facial swelling resolved.  Rash resolved completely.  Patient reports feeling better.  Denies any tongue  or lip swelling sensation.  No difficulty breathing.  Plan to p.o. challenge patient in the department.  Vital signs are stable.  4:15 PM: Re-evaluation dispose 4 hours after EpiPen injection.  Patient with no current symptoms at this time.  No facial or oral swelling noted.  Reevaluation of lung shows no evidence of respiratory distress.  Rash has resolved completely.  Patient is ambulating in the department tolerating p.o. without any difficulty.  She states that she is not currently having any symptoms.  We will plan to discharge home with EpiPen for further use. Additionally will provide benadryl, pepcid, and prednisone for the next few days for symptomatic relief.  [facility] provided [facility]. Patient had ample opportunity for questions and discussion. All patient's questions were answered with full understanding. Strict return precautions discussed. Patient expresses understanding and agreement to plan.   Final Clinical Impressions(s) / ED Diagnoses   Final diagnoses:  Allergic reaction, initial encounter  Insect bite of left lower leg, initial encounter    ED Discharge Orders        Ordered    EPINEPHrine (EPIPEN 2-PAK) 0.3 mg/0.3 mL IJ SOAJ injection   Once     01/03/18 1614    diphenhydrAMINE (BENADRYL) 25 MG tablet  Every 6 hours     01/03/18 1614    famotidine (PEPCID) 20 MG tablet  2 times daily     01/03/18 1614    predniSONE (DELTASONE) 20 MG tablet  Daily     01/03/18 1614       Rosana HoesLayden, Lindsey A,  PA-C 01/03/18 1620    Derwood KaplanNanavati, Ankit, MD 01/04/18 [phone]

## 2018-10-10 ENCOUNTER — Encounter (HOSPITAL_COMMUNITY): Payer: Self-pay | Admitting: Emergency Medicine

## 2018-10-10 ENCOUNTER — Emergency Department (HOSPITAL_COMMUNITY)
Admission: EM | Admit: 2018-10-10 | Discharge: 2018-10-10 | Disposition: A | Discharge status: Home / Self Care | Payer: Medicaid Other | Attending: Emergency Medicine | Admitting: Emergency Medicine

## 2018-10-10 ENCOUNTER — Emergency Department (HOSPITAL_COMMUNITY)
Admission: EM | Admit: 2018-10-10 | Discharge: 2018-10-10 | Disposition: A | Discharge status: Home / Self Care | Payer: Self-pay | Attending: Emergency Medicine | Admitting: Emergency Medicine

## 2018-10-10 ENCOUNTER — Other Ambulatory Visit: Payer: Self-pay

## 2018-10-10 DIAGNOSIS — Z79899 Other long term (current) drug therapy: Secondary | ICD-10-CM | POA: Insufficient documentation

## 2018-10-10 DIAGNOSIS — K047 Periapical abscess without sinus: Secondary | ICD-10-CM | POA: Insufficient documentation

## 2018-10-10 DIAGNOSIS — K0889 Other specified disorders of teeth and supporting structures: Secondary | ICD-10-CM

## 2018-10-10 DIAGNOSIS — K089 Disorder of teeth and supporting structures, unspecified: Secondary | ICD-10-CM | POA: Insufficient documentation

## 2018-10-10 DIAGNOSIS — K029 Dental caries, unspecified: Secondary | ICD-10-CM | POA: Insufficient documentation

## 2018-10-10 MED ORDER — ACETAMINOPHEN 325 MG PO TABS
650.0000 mg | ORAL_TABLET | Freq: Once | ORAL | Status: AC
  Administered 2018-10-10: 650 mg via ORAL
  Filled 2018-10-10: qty 2

## 2018-10-10 MED ORDER — ACETAMINOPHEN 325 MG PO TABS: 650.0000 mg | ORAL_TABLET | Freq: Four times a day (QID) | ORAL | 0 refills | Status: DC | PRN

## 2018-10-10 MED ORDER — NAPROXEN 250 MG PO TABS
500.0000 mg | ORAL_TABLET | Freq: Once | ORAL | Status: AC
Start: 2018-10-10 — End: 2018-10-10
  Administered 2018-10-10: 500 mg via ORAL
  Filled 2018-10-10: qty 2

## 2018-10-10 MED ORDER — PENICILLIN V POTASSIUM 500 MG PO TABS: 500.0000 mg | ORAL_TABLET | Freq: Four times a day (QID) | ORAL | 0 refills | Status: AC

## 2018-10-10 MED ORDER — NAPROXEN 500 MG PO TABS: 500.0000 mg | ORAL_TABLET | Freq: Two times a day (BID) | ORAL | 0 refills | Status: DC

## 2018-10-10 NOTE — Discharge Instructions (Addendum)
We saw you in the ER for the toothache. °No evidence of deep infection, no evidence of pus that can be drained out. °YOU WILL NEED TO SEE A DENTIST to get appropriate care. °Please take the antibiotics and pain meds provided in the interval. ° °Return to the ER if there is drooling, difficulty breathing, difficulty opening mouth, fevers, chills, confusion, headaches, seizures. ° °

## 2018-10-10 NOTE — ED Triage Notes (Signed)
C/o R lower toothache x 2 weeks.  States she was seen in ED earlier today for same and pain is worse.

## 2018-10-10 NOTE — ED Provider Notes (Signed)
[facility] EMERGENCY DEPARTMENT Provider Note   CSN: [id number] Arrival date & time: 10/10/18  2120    History   Chief Complaint Chief Complaint  Patient presents with  . Dental Pain    HPI Kaylee Kelly is a 33 y.o. female.     Patient presents to the emergency department with a dental complaint. Symptoms began 2 weeks ago, was seen for the same today. The patient has tried to alleviate pain with naprosyn and tylenol.  Pain rated at a 10/10, characterized as throbbing in nature and located right lower rear molar. Patient denies fever, night sweats, chills, difficulty swallowing or opening mouth, SOB, nuchal rigidity or decreased ROM of neck.  Patient does not have a dentist and requests a resource guide at discharge.   The history is provided by the patient. No language interpreter was used.    Past Medical History:  Diagnosis Date  . Anemia   . Headache     There are no active problems to display for this patient.   Past Surgical History:  Procedure Laterality Date  . APPENDECTOMY    . CESAREAN SECTION    . CESAREAN SECTION N/A 01/21/2015   Procedure: CESAREAN SECTION;  Surgeon: Tereso Newcomer, MD;  Location: [facility];  Service: Obstetrics;  Laterality: N/A;     OB History    Gravida  2   Para  2   Term  2   Preterm      AB      Living  2     SAB      TAB      Ectopic      Multiple  0   Live Births  2            Home Medications    Prior to Admission medications   Medication Sig Start Date End Date Taking? Authorizing Provider  acetaminophen (TYLENOL) 325 MG tablet Take 2 tablets (650 mg total) by mouth every 6 (six) hours as needed. 10/10/18   Derwood Kaplan, MD  diphenhydrAMINE (BENADRYL) 25 MG tablet Take 1 tablet (25 mg total) by mouth every 6 (six) hours for 4 days. 01/03/18 01/07/18  Maxwell Caul, PA-C  famotidine (PEPCID) 20 MG tablet Take 1 tablet (20 mg total) by mouth 2 (two) times daily for 4 days. 01/03/18  01/07/18  Maxwell Caul, PA-C  fluticasone (FLONASE) 50 MCG/ACT nasal spray Place 2 sprays into both nostrils daily. Patient not taking: Reported on 01/03/2018 03/13/16   Domenick Gong, MD  ibuprofen (ADVIL,MOTRIN) 800 MG tablet Take 1 tablet (800 mg total) by mouth every 8 (eight) hours as needed. Patient not taking: Reported on 01/03/2018 12/25/17   Antony Madura, PA-C  mometasone (NASONEX) 50 MCG/ACT nasal spray Place 2 sprays into the nose daily. Patient not taking: Reported on 01/03/2018 03/13/16   Domenick Gong, MD  naproxen (NAPROSYN) 500 MG tablet Take 1 tablet (500 mg total) by mouth 2 (two) times daily with a meal. 10/10/18   Derwood Kaplan, MD  penicillin v potassium (VEETID) 500 MG tablet Take 1 tablet (500 mg total) by mouth 4 (four) times daily for 7 days. 10/10/18 10/17/18  Derwood Kaplan, MD  phenol (CHLORASEPTIC) 1.4 % LIQD Use as directed 1 spray in the mouth or throat as needed for throat irritation / pain. Patient not taking: Reported on 01/03/2018 12/25/17   Antony Madura, PA-C  Prenatal Multivit-Min-Fe-FA (PRENATAL VITAMINS) 0.8 MG tablet Take 1 tablet by mouth daily. Patient not  taking: Reported on 01/03/2018 07/31/14   Aviva Signs, CNM  traMADol (ULTRAM) 50 MG tablet Take 1 tablet (50 mg total) by mouth every 6 (six) hours as needed for severe pain. Patient not taking: Reported on 01/03/2018 01/31/16   Charlestine Night, PA-C    Family History Family History  Problem Relation Age of Onset  . Hyperlipidemia Father   . Diabetes Maternal Grandmother     Social History Social History   Tobacco Use  . Smoking status: Never Smoker  . Smokeless tobacco: Never Used  Substance Use Topics  . Alcohol use: Yes    Comment: socially; none now with preg   . Drug use: No     Allergies   Patient has no known allergies.   Review of Systems Review of Systems  Constitutional: Negative for chills and fever.  HENT: Positive for dental problem. Negative for drooling.    Neurological: Negative for speech difficulty.  Psychiatric/Behavioral: Positive for sleep disturbance.     Physical Exam Updated Vital Signs BP 116/74 (BP Location: Right Arm)   Pulse 81   Temp 98.2 F (36.8 C) (Oral)   Resp 20   SpO2 97%   Physical Exam Physical Exam  Constitutional: Pt appears well-developed and well-nourished.  HENT:  Head: Normocephalic.  Right Ear: Tympanic membrane, external ear and ear canal normal.  Left Ear: Tympanic membrane, external ear and ear canal normal.  Nose: Nose normal. Right sinus exhibits no maxillary sinus tenderness and no frontal sinus tenderness. Left sinus exhibits no maxillary sinus tenderness and no frontal sinus tenderness.  Mouth/Throat: Uvula is midline, oropharynx is clear and moist and mucous membranes are normal. No oral lesions. No uvula swelling or lacerations. No oropharyngeal exudate, posterior oropharyngeal edema, posterior oropharyngeal erythema or tonsillar abscesses.  Poor dentition No gingival swelling, fluctuance or induration No gross abscess  No sublingual edema, tenderness to palpation, or sign of Ludwig's angina, or deep space infection Pain at right lower rear molars Eyes: Conjunctivae are normal. Pupils are equal, round, and reactive to light. Right eye exhibits no discharge. Left eye exhibits no discharge.  Neck: Normal range of motion. Neck supple.  No stridor Handling secretions without difficulty No nuchal rigidity No cervical lymphadenopathy Cardiovascular: Normal rate, regular rhythm and normal heart sounds.   Pulmonary/Chest: Effort normal. No respiratory distress.  Equal chest rise  Abdominal: Soft. Bowel sounds are normal. Pt exhibits no distension. There is no tenderness.  Lymphadenopathy: Pt has no cervical adenopathy.  Neurological: Pt is alert and oriented x 4  Skin: Skin is warm and dry.  Psychiatric: Pt has a normal mood and affect.  Nursing note and vitals reviewed.    ED Treatments /  Results  Labs (all labs ordered are listed, but only abnormal results are displayed) Labs Reviewed - No data to display  EKG None  Radiology No results found.  Procedures Dental Block Date/Time: 10/10/2018 10:33 PM Performed by: Roxy Horseman, PA-C Authorized by: Roxy Horseman, PA-C   Consent:    Consent obtained:  Verbal   Alternatives discussed:  No treatment Indications:    Indications: dental pain   Location:    Block type:  Inferior alveolar   Laterality:  Right Procedure details (see MAR for exact dosages):    Topical anesthetic:  Benzocaine gel   Needle gauge:  25 G   Anesthetic injected:  Bupivacaine 0.25% WITH epi Post-procedure details:    Outcome:  Pain improved   Patient tolerance of procedure:  Tolerated well, no  immediate complications   (including critical care time)  Medications Ordered in ED Medications - No data to display   Initial Impression / Assessment and Plan / ED Course  I have reviewed the triage vital signs and the nursing notes.  Pertinent labs & imaging results that were available during my care of the patient were reviewed by me and considered in my medical decision making (see chart for details).        Patient with persistent dental pain.  Given penicillin this morning.  Will give oral surgery follow-up.  Final Clinical Impressions(s) / ED Diagnoses   Final diagnoses:  Pain, dental    ED Discharge Orders    None       Felipa Furnace 10/10/18 2236    Terrilee Files, MD 10/11/18 1214

## 2018-10-10 NOTE — ED Triage Notes (Signed)
Pt reports having a toothache that started 1 week ago. Pt reports taking OTC meds without relief. Pt also does not have a dentist and would like resources for same.

## 2018-10-10 NOTE — ED Provider Notes (Signed)
[facility] Provider Note   CSN: [id number] Arrival date & time: 10/10/18  [id number]    History   Chief Complaint Chief Complaint  Patient presents with  . Dental Pain    HPI Kaylee Kelly is a 33 y.o. female.     HPI 33 year old female comes in with chief complaint of toothache. Patient does not have significant medical history.  She reports that about a week ago she started having right-sided tooth pain.  Upon close examination she noted that she had chipped her tooth.  Patient is not having pain over the right side of her mouth including ear.  She has been taking over-the-counter medications without significant relief.  Patient denies any associated nausea, vomiting, fevers, chills.  She has no known history of dental problems.  Past Medical History:  Diagnosis Date  . Anemia   . Headache     There are no active problems to display for this patient.   Past Surgical History:  Procedure Laterality Date  . APPENDECTOMY    . CESAREAN SECTION    . CESAREAN SECTION N/A 01/21/2015   Procedure: CESAREAN SECTION;  Surgeon: Tereso NewcomerUgonna A Anyanwu, MD;  Location: [facility];  Service: Obstetrics;  Laterality: N/A;     OB History    Gravida  2   Para  2   Term  2   Preterm      AB      Living  2     SAB      TAB      Ectopic      Multiple  0   Live Births  2            Home Medications    Prior to Admission medications   Medication Sig Start Date End Date Taking? Authorizing Provider  acetaminophen (TYLENOL) 325 MG tablet Take 2 tablets (650 mg total) by mouth every 6 (six) hours as needed. 10/10/18   Derwood KaplanNanavati, Campbell Agramonte, MD  diphenhydrAMINE (BENADRYL) 25 MG tablet Take 1 tablet (25 mg total) by mouth every 6 (six) hours for 4 days. 01/03/18 01/07/18  Maxwell CaulLayden, Lindsey A, PA-C  famotidine (PEPCID) 20 MG tablet Take 1 tablet (20 mg total) by mouth 2 (two) times daily for 4 days. 01/03/18 01/07/18  Maxwell CaulLayden, Lindsey A, PA-C  fluticasone  (FLONASE) 50 MCG/ACT nasal spray Place 2 sprays into both nostrils daily. Patient not taking: Reported on 01/03/2018 03/13/16   Domenick GongMortenson, Ashley, MD  ibuprofen (ADVIL,MOTRIN) 800 MG tablet Take 1 tablet (800 mg total) by mouth every 8 (eight) hours as needed. Patient not taking: Reported on 01/03/2018 12/25/17   Antony MaduraHumes, Kelly, PA-C  mometasone (NASONEX) 50 MCG/ACT nasal spray Place 2 sprays into the nose daily. Patient not taking: Reported on 01/03/2018 03/13/16   Domenick GongMortenson, Ashley, MD  naproxen (NAPROSYN) 500 MG tablet Take 1 tablet (500 mg total) by mouth 2 (two) times daily with a meal. 10/10/18   Derwood KaplanNanavati, Arrow Emmerich, MD  penicillin v potassium (VEETID) 500 MG tablet Take 1 tablet (500 mg total) by mouth 4 (four) times daily for 7 days. 10/10/18 10/17/18  Derwood KaplanNanavati, Linkyn Gobin, MD  phenol (CHLORASEPTIC) 1.4 % LIQD Use as directed 1 spray in the mouth or throat as needed for throat irritation / pain. Patient not taking: Reported on 01/03/2018 12/25/17   Antony MaduraHumes, Kelly, PA-C  Prenatal Multivit-Min-Fe-FA (PRENATAL VITAMINS) 0.8 MG tablet Take 1 tablet by mouth daily. Patient not taking: Reported on 01/03/2018 07/31/14   Aviva SignsWilliams, Marie L, CNM  traMADol (ULTRAM) 50 MG tablet Take 1 tablet (50 mg total) by mouth every 6 (six) hours as needed for severe pain. Patient not taking: Reported on 01/03/2018 01/31/16   Charlestine Night, PA-C    Family History Family History  Problem Relation Age of Onset  . Hyperlipidemia Father   . Diabetes Maternal Grandmother     Social History Social History   Tobacco Use  . Smoking status: Never Smoker  . Smokeless tobacco: Never Used  Substance Use Topics  . Alcohol use: Yes    Comment: socially; none now with preg   . Drug use: No     Allergies   Patient has no known allergies.   Review of Systems Review of Systems  Constitutional: Positive for activity change.  HENT: Positive for dental problem. Negative for facial swelling.   Allergic/Immunologic: Negative for  immunocompromised state.     Physical Exam Updated Vital Signs BP 102/72   Pulse 69   Temp 98.1 F (36.7 C) (Oral)   Resp 16   Ht 5\' 4"  (1.626 m)   Wt 54.4 kg   LMP  (Exact Date)   SpO2 99%   BMI 20.60 kg/m   Physical Exam Vitals signs and nursing note reviewed.  Constitutional:      Appearance: She is well-developed.  HENT:     Head: Normocephalic and atraumatic.  Neck:     Musculoskeletal: Normal range of motion and neck supple.  Cardiovascular:     Rate and Rhythm: Normal rate.  Pulmonary:     Effort: Pulmonary effort is normal.  Abdominal:     General: Bowel sounds are normal.  Skin:    General: Skin is warm and dry.  Neurological:     Mental Status: She is alert and oriented to person, place, and time.      ED Treatments / Results  Labs (all labs ordered are listed, but only abnormal results are displayed) Labs Reviewed - No data to display  EKG None  Radiology No results found.  Procedures Procedures (including critical care time)  Medications Ordered in ED Medications  naproxen (NAPROSYN) tablet 500 mg (500 mg Oral Given 10/10/18 0045)  acetaminophen (TYLENOL) tablet 650 mg (650 mg Oral Given 10/10/18 9977)     Initial Impression / Assessment and Plan / ED Course  I have reviewed the triage vital signs and the nursing notes.  Pertinent labs & imaging results that were available during my care of the patient were reviewed by me and considered in my medical decision making (see chart for details).        DDx includes: - Periapical tooth infection - Dental abscess - Gingivitis - Dental trauma - Pulpitis - Nerve root compression  Will treat with antibiotics. Will d/c. Strict ER return precautions have been discussed, and patient is agreeing with the plan and is comfortable with the workup done and the recommendations from the ER.   Final Clinical Impressions(s) / ED Diagnoses   Final diagnoses:  Dental caries  Dental infection     ED Discharge Orders         Ordered    acetaminophen (TYLENOL) 325 MG tablet  Every 6 hours PRN     10/10/18 0628    penicillin v potassium (VEETID) 500 MG tablet  4 times daily     10/10/18 0628    naproxen (NAPROSYN) 500 MG tablet  2 times daily with meals     10/10/18 4142  Derwood Kaplan, MD 10/10/18 [phone]

## 2019-11-28 ENCOUNTER — Encounter (HOSPITAL_COMMUNITY): Payer: Self-pay | Admitting: Emergency Medicine

## 2019-11-28 ENCOUNTER — Emergency Department (HOSPITAL_COMMUNITY)
Admission: EM | Admit: 2019-11-28 | Discharge: 2019-11-28 | Disposition: A | Discharge status: Home / Self Care | Payer: 59 | Attending: Emergency Medicine | Admitting: Emergency Medicine

## 2019-11-28 DIAGNOSIS — Z79899 Other long term (current) drug therapy: Secondary | ICD-10-CM | POA: Insufficient documentation

## 2019-11-28 DIAGNOSIS — R5383 Other fatigue: Secondary | ICD-10-CM | POA: Diagnosis not present

## 2019-11-28 DIAGNOSIS — R040 Epistaxis: Secondary | ICD-10-CM | POA: Insufficient documentation

## 2019-11-28 LAB — URINALYSIS, ROUTINE W REFLEX MICROSCOPIC
Bilirubin Urine: NEGATIVE
Glucose, UA: NEGATIVE mg/dL
Hgb urine dipstick: NEGATIVE
Ketones, ur: NEGATIVE mg/dL
Leukocytes,Ua: NEGATIVE
Nitrite: NEGATIVE
Protein, ur: NEGATIVE mg/dL
Specific Gravity, Urine: 1.025 (ref 1.005–1.030)
pH: 7 (ref 5.0–8.0)

## 2019-11-28 LAB — CBC
HCT: 39.1 % (ref 36.0–46.0)
Hemoglobin: 12.7 g/dL (ref 12.0–15.0)
MCH: 30.2 pg (ref 26.0–34.0)
MCHC: 32.5 g/dL (ref 30.0–36.0)
MCV: 92.9 fL (ref 80.0–100.0)
Platelets: 250 10*3/uL (ref 150–400)
RBC: 4.21 MIL/uL (ref 3.87–5.11)
RDW: 12.3 % (ref 11.5–15.5)
WBC: 8.1 10*3/uL (ref 4.0–10.5)
nRBC: 0 % (ref 0.0–0.2)

## 2019-11-28 LAB — BASIC METABOLIC PANEL
Anion gap: 11 (ref 5–15)
BUN: 11 mg/dL (ref 6–20)
CO2: 25 mmol/L (ref 22–32)
Calcium: 8.8 mg/dL — ABNORMAL LOW (ref 8.9–10.3)
Chloride: 104 mmol/L (ref 98–111)
Creatinine, Ser: 0.62 mg/dL (ref 0.44–1.00)
GFR calc Af Amer: >60 mL/min (ref >60)
GFR calc non Af Amer: >60 mL/min (ref >60)
Glucose, Bld: 122 mg/dL — ABNORMAL HIGH (ref 70–99)
Potassium: 3.4 mmol/L — ABNORMAL LOW (ref 3.5–5.1)
Sodium: 140 mmol/L (ref 135–145)

## 2019-11-28 LAB — TSH: TSH: 8.126 u[IU]/mL — ABNORMAL HIGH (ref 0.350–4.500)

## 2019-11-28 LAB — I-STAT BETA HCG BLOOD, ED (MC, WL, AP ONLY): I-stat hCG, quantitative: <5 m[IU]/mL (ref <5)

## 2019-11-28 MED ORDER — SODIUM CHLORIDE 0.9% FLUSH: 3.0000 mL | Freq: Once | INTRAVENOUS | Status: DC

## 2019-11-28 MED ORDER — POTASSIUM CHLORIDE CRYS ER 20 MEQ PO TBCR
40.0000 meq | EXTENDED_RELEASE_TABLET | Freq: Once | ORAL | Status: AC
  Administered 2019-11-28: 22:00:00 40 meq via ORAL
  Filled 2019-11-28: qty 2

## 2019-11-28 NOTE — ED Notes (Signed)
Pt came back to the lobby

## 2019-11-28 NOTE — Discharge Instructions (Signed)
As we discussed, your work-up here was reassuring.  It is unclear what is causing your symptoms.  I did order a thyroid test on you.  This will take a few hours to come back.  You can check online in the MyChart app regarding this results.  This will need to be reevaluated by your primary care doctor once you have established a primary care doctor.  Return to the emergency department for chest pain, difficulty breathing, vomiting, fevers or any other worsening or concerning symptoms.

## 2019-11-28 NOTE — ED Provider Notes (Signed)
[place] EMERGENCY DEPARTMENT Provider Note   CSN: [id number] Arrival date & time: 11/28/19  1455     History Chief Complaint  Patient presents with  . Fatigue    Kaylee Kelly is a 34 y.o. female past mostly of anemia who presents for evaluation of fatigue.  She reports that over the last month, she has felt rundown, feeling like she has no energy and is fatigued.  Patient reports that this has been an ongoing issue.  She reports that she will be at work and does feel like she has no more energy left.  She feels like in the last week or so it is gotten worse.  She states it is generalized weakness and has no focal weakness.  She states she initially at the beginning of her symptoms had some watery eyes, irritation of her nose.  She had one episode of nosebleed.  That has since resolved.  She still has some watery eyes but states she has not had any more epistaxis.  She was told that this may be allergies and she has been trying to take over-the-counter allergy medication with minimal improvement.  Patient reports that over the last week, she feels like it is gotten worse.  She states she has had about 2-3 loose stools over the last week.  In stools.  She does report that sometimes when she drinks water, she will get a pain in her chest as it is going down but otherwise denies any other chest pain.  She has not had any fevers, chills, cough, chest pain, difficulty breathing, abdominal pain, nausea/vomiting.  She denies any sick contacts.  No known COVID-19 exposure.  She has not gotten a COVID-19 vaccine yet.  She denies any history of IV drug use.  The history is provided by the patient.       Past Medical History:  Diagnosis Date  . Anemia   . Headache     There are no problems to display for this patient.   Past Surgical History:  Procedure Laterality Date  . APPENDECTOMY    . CESAREAN SECTION    . CESAREAN SECTION N/A 01/21/2015   Procedure: CESAREAN SECTION;   Surgeon: Osborne Oman, MD;  Location: [facility];  Service: Obstetrics;  Laterality: N/A;     OB History    Gravida  2   Para  2   Term  2   Preterm      AB      Living  2     SAB      TAB      Ectopic      Multiple  0   Live Births  2           Family History  Problem Relation Age of Onset  . Hyperlipidemia Father   . Diabetes Maternal Grandmother     Social History   Tobacco Use  . Smoking status: Never Smoker  . Smokeless tobacco: Never Used  Substance Use Topics  . Alcohol use: Yes    Comment: socially; none now with preg   . Drug use: No    Home Medications Prior to Admission medications   Medication Sig Start Date End Date Taking? Authorizing Provider  acetaminophen (TYLENOL) 325 MG tablet Take 2 tablets (650 mg total) by mouth every 6 (six) hours as needed. 10/10/18   Varney Biles, MD  diphenhydrAMINE (BENADRYL) 25 MG tablet Take 1 tablet (25 mg total) by mouth every 6 (six)  hours for 4 days. 01/03/18 01/07/18  Maxwell Caul, PA-C  famotidine (PEPCID) 20 MG tablet Take 1 tablet (20 mg total) by mouth 2 (two) times daily for 4 days. 01/03/18 01/07/18  Maxwell Caul, PA-C  fluticasone (FLONASE) 50 MCG/ACT nasal spray Place 2 sprays into both nostrils daily. Patient not taking: Reported on 01/03/2018 03/13/16   Domenick Gong, MD  ibuprofen (ADVIL,MOTRIN) 800 MG tablet Take 1 tablet (800 mg total) by mouth every 8 (eight) hours as needed. Patient not taking: Reported on 01/03/2018 12/25/17   Antony Madura, PA-C  mometasone (NASONEX) 50 MCG/ACT nasal spray Place 2 sprays into the nose daily. Patient not taking: Reported on 01/03/2018 03/13/16   Domenick Gong, MD  naproxen (NAPROSYN) 500 MG tablet Take 1 tablet (500 mg total) by mouth 2 (two) times daily with a meal. 10/10/18   Nanavati, Ankit, MD  phenol (CHLORASEPTIC) 1.4 % LIQD Use as directed 1 spray in the mouth or throat as needed for throat irritation / pain. Patient not taking: Reported on  01/03/2018 12/25/17   Antony Madura, PA-C  Prenatal Multivit-Min-Fe-FA (PRENATAL VITAMINS) 0.8 MG tablet Take 1 tablet by mouth daily. Patient not taking: Reported on 01/03/2018 07/31/14   Aviva Signs, CNM  traMADol (ULTRAM) 50 MG tablet Take 1 tablet (50 mg total) by mouth every 6 (six) hours as needed for severe pain. Patient not taking: Reported on 01/03/2018 01/31/16   Charlestine Night, PA-C    Allergies    Patient has no known allergies.  Review of Systems   Review of Systems  Constitutional: Positive for fatigue. Negative for fever.  Respiratory: Negative for shortness of breath.   Cardiovascular: Negative for chest pain.  Gastrointestinal: Positive for diarrhea. Negative for abdominal pain, nausea and vomiting.  Genitourinary: Negative for dysuria and hematuria.  Neurological: Positive for weakness (generalized). Negative for headaches.  All other systems reviewed and are negative.   Physical Exam Updated Vital Signs BP 100/63   Pulse 69   Temp 98.4 F (36.9 C) (Oral)   Resp 14   SpO2 100%   Physical Exam Vitals and nursing note reviewed.  Constitutional:      Appearance: Normal appearance. She is well-developed.  HENT:     Head: Normocephalic and atraumatic.  Eyes:     General: Lids are normal.     Conjunctiva/sclera: Conjunctivae normal.     Pupils: Pupils are equal, round, and reactive to light.     Comments: PERRL. EOMs intact. No nystagmus. No neglect.   Cardiovascular:     Rate and Rhythm: Normal rate and regular rhythm.     Pulses: Normal pulses.          Radial pulses are 2+ on the right side and 2+ on the left side.     Heart sounds: Normal heart sounds. No murmur. No friction rub. No gallop.   Pulmonary:     Effort: Pulmonary effort is normal.     Breath sounds: Normal breath sounds.     Comments: Lungs clear to auscultation bilaterally.  Symmetric chest rise.  No wheezing, rales, rhonchi. Abdominal:     Palpations: Abdomen is soft. Abdomen is not  rigid.     Tenderness: There is no abdominal tenderness. There is no guarding.     Comments: Abdomen is soft, non-distended, non-tender. No rigidity, No guarding. No peritoneal signs.  Musculoskeletal:        General: Normal range of motion.     Cervical back: Full passive range of  motion without pain.  Skin:    General: Skin is warm and dry.     Capillary Refill: Capillary refill takes less than 2 seconds.  Neurological:     Mental Status: She is alert and oriented to person, place, and time.     Comments: Cranial nerves III-XII intact Follows commands, Moves all extremities  5/5 strength to BUE and BLE  Sensation intact throughout all major nerve distributions  No gait abnormalities  No slurred speech. No facial droop.   Psychiatric:        Speech: Speech normal.     ED Results / Procedures / Treatments   Labs (all labs ordered are listed, but only abnormal results are displayed) Labs Reviewed  BASIC METABOLIC PANEL - Abnormal; Notable for the following components:      Result Value   Potassium 3.4 (*)    Glucose, Bld 122 (*)    Calcium 8.8 (*)    All other components within normal limits  URINALYSIS, ROUTINE W REFLEX MICROSCOPIC - Abnormal; Notable for the following components:   APPearance CLOUDY (*)    All other components within normal limits  CBC  TSH  I-STAT BETA HCG BLOOD, ED ([facility], [facility], [facility] ONLY)    EKG None  Radiology No results found.  Procedures Procedures (including critical care time)  Medications Ordered in ED Medications  sodium chloride flush (NS) 0.9 % injection 3 mL (3 mLs Intravenous Not Given 11/28/19 2206)  potassium chloride SA (KLOR-CON) CR tablet 40 mEq (40 mEq Oral Given 11/28/19 2202)    ED Course  I have reviewed the triage vital signs and the nursing notes.  Pertinent labs & imaging results that were available during my care of the patient were reviewed by me and considered in my medical decision making (see chart for details).     MDM Rules/Calculators/A&P                      34 year old female who presents for generalized fatigue that has been ongoing for about a month.  At onset of symptoms, she had some watery eyes, nasal discharge and an episode of epistaxis.  She still has some of the watery eyes but has been taking over-the-counter allergy medication.  She states that over the last week, she felt like her fatigue is gotten worse.  She feels like she is worn down and has no energy.  She reports that she goes to work and within a few hours, she is very tired.  She has not any vomiting but has had some episodes of diarrhea.  Denies any other bleeding, vomiting, fevers, infectious symptoms.  She does report that occasionally when she drinks water she gets a burning sensation in her throat that goes into her chest.  No other chest pain.  On initially arrival, she is afebrile, nontoxic-appearing.  Vital signs are stable.  On exam, she has no neuro deficits.  She has no signs of any gait abnormalities.  Additionally, she has benign abdominal exam, benign lung.  Doubt infectious etiology but is a consideration.  History/physical exam not concerning for CVA, intracranial hemorrhage, intracranial mass.  Consider electrolyte abnormality versus orthostatic hypotension.  Labs ordered at triage.  Do not suspect ACS etiology.  She reports some burning pain whenever she drinks something but otherwise does not have any chest pain.  BMP shows potassium of 3.4.  BUN and creatinine within normal limits.  I-STAT beta negative.  CBC shows no leukocytosis or anemia.  UA negative for any infectious etiology.  I discussed results with patient.  I did discuss regarding further evaluation here in the ED and offered her a Covid test.  I discussed with her that this may be unlikely but given fatigue, could be a consideration.  Patient declined to have Covid test here in the ED which I feel is reasonable.  I discussed with her that overall, unclear etiology  of what is causing her symptoms.  At this time, there is no acute emergent condition that would require admission.  We will plan to give her PCP referral for further evaluation and possible full physical exam and testing.  We will plan to send out a TSH. At this time, patient exhibits no emergent life-threatening condition that require further evaluation in ED or admission. Patient had ample opportunity for questions and discussion. All patient's questions were answered with full understanding. Strict return precautions discussed. Patient expresses understanding and agreement to plan.   Portions of this note were generated with Scientist, clinical (histocompatibility and immunogenetics). Dictation errors may occur despite best attempts at proofreading.  Final Clinical Impression(s) / ED Diagnoses Final diagnoses:  Fatigue, unspecified type    Rx / DC Orders ED Discharge Orders    None       Rosana Hoes 11/28/19 2220    Charlynne Pander, MD 11/29/19 1500

## 2019-11-28 NOTE — ED Notes (Signed)
Pt called for bed with no response

## 2019-11-28 NOTE — ED Triage Notes (Signed)
Pt reports for over 1 month has been feeling fatigued and tired. Pt also reports allergy symptoms such as watery eyes and burning in her throat.

## 2020-01-11 IMAGING — DX DG CHEST 2V
2 series · 2 of 2 positions shown · non-contrast
Comparison: None.

CLINICAL DATA: Initial evaluation for acute congestion.

EXAM:
CHEST - 2 VIEW

[chest pa]
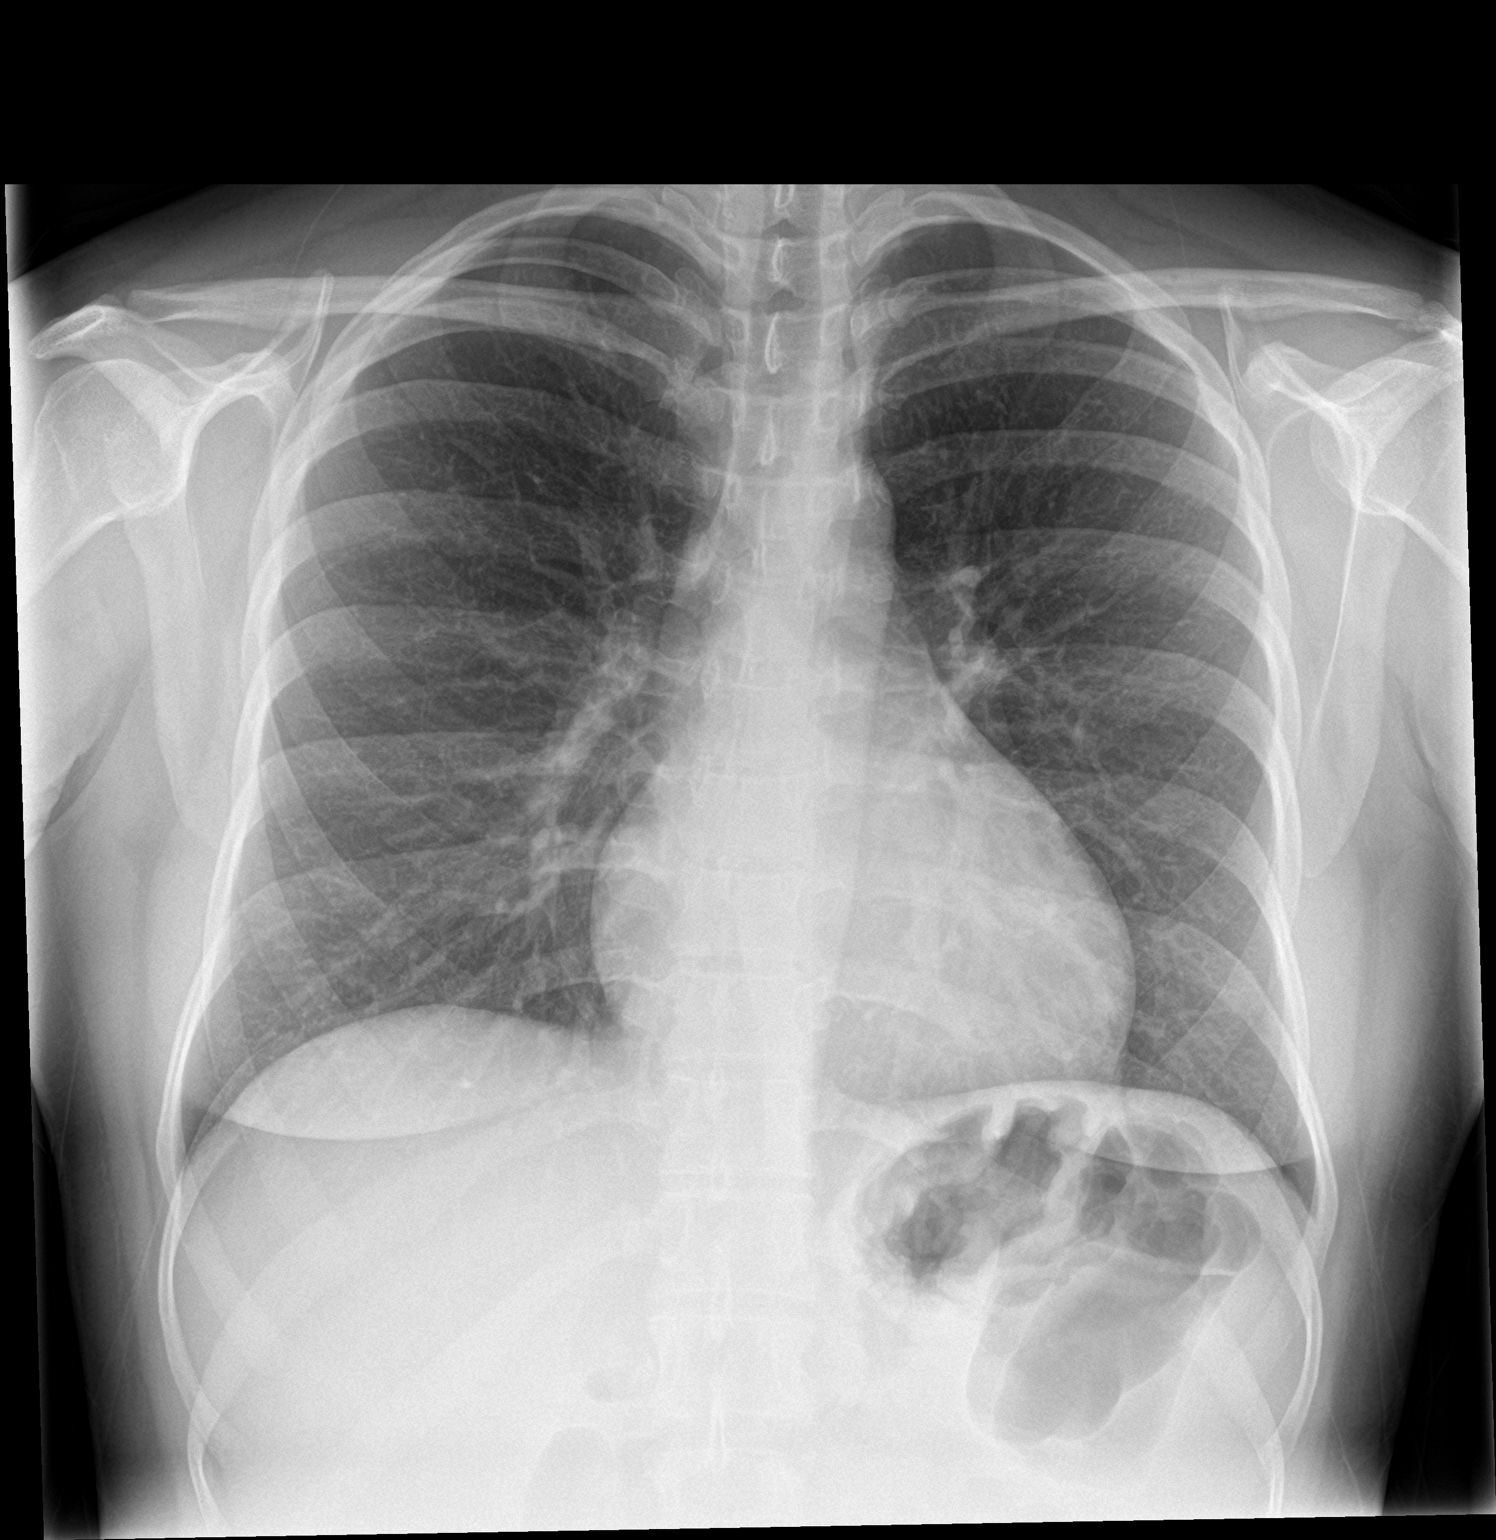

[chest lat]
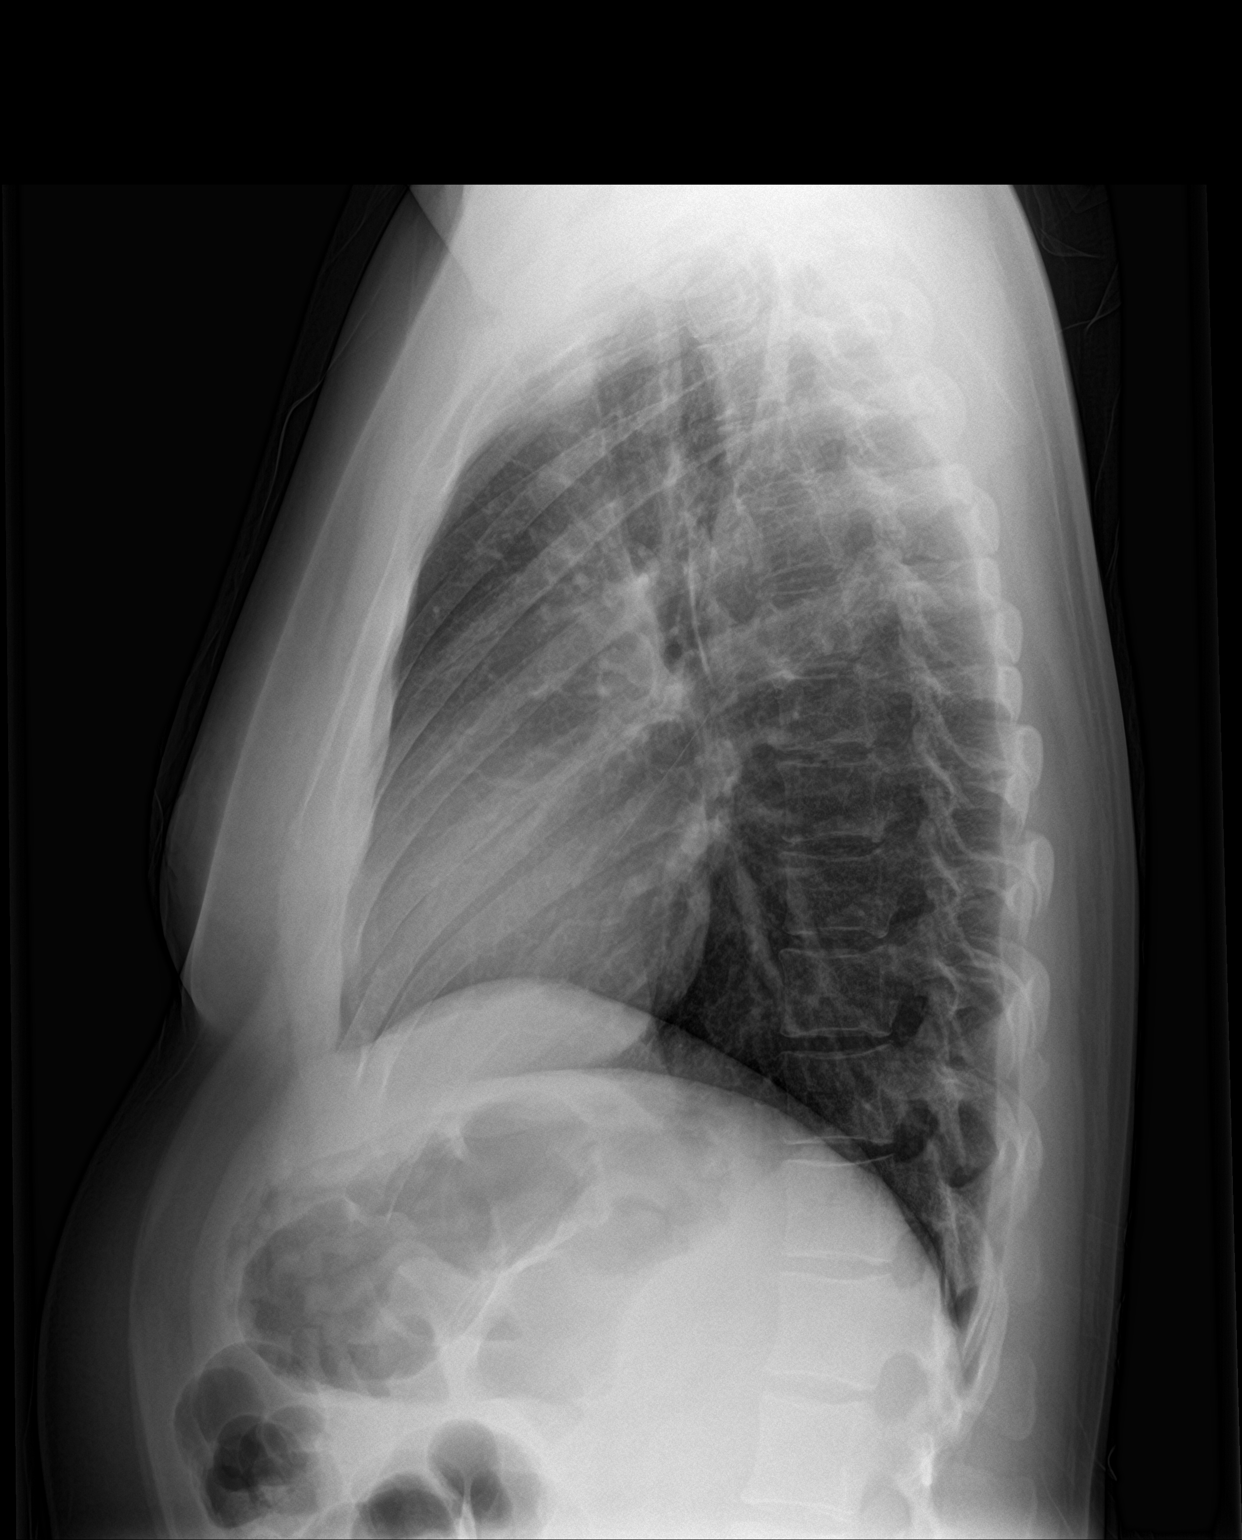

[2 of 2 positions shown; findings below may reference images not displayed]

FINDINGS: The cardiac and mediastinal silhouettes are within normal limits.

The lungs are normally inflated. No airspace consolidation, pleural
effusion, or pulmonary edema is identified. There is no
pneumothorax.

No acute osseous abnormality identified.
IMPRESSION: No active cardiopulmonary disease.

## 2020-03-01 ENCOUNTER — Ambulatory Visit: Payer: 59 | Admitting: Obstetrics and Gynecology

## 2020-03-02 ENCOUNTER — Encounter: Payer: Self-pay | Admitting: Obstetrics

## 2020-03-02 ENCOUNTER — Other Ambulatory Visit (HOSPITAL_COMMUNITY)
Admission: RE | Admit: 2020-03-02 | Discharge: 2020-03-02 | Disposition: A | Discharge status: Home / Self Care | Payer: 59 | Source: Ambulatory Visit | Attending: Obstetrics and Gynecology | Admitting: Obstetrics and Gynecology

## 2020-03-02 ENCOUNTER — Ambulatory Visit: Payer: 59 | Admitting: Obstetrics

## 2020-03-02 ENCOUNTER — Other Ambulatory Visit: Payer: Self-pay

## 2020-03-02 ENCOUNTER — Other Ambulatory Visit (HOSPITAL_COMMUNITY)
Admission: RE | Admit: 2020-03-02 | Discharge: 2020-03-02 | Disposition: A | Discharge status: Home / Self Care | Payer: 59 | Source: Ambulatory Visit | Attending: Obstetrics | Admitting: Obstetrics

## 2020-03-02 VITALS — BP 105/69 | HR 80 | Wt 134.3 lb

## 2020-03-02 DIAGNOSIS — N9089 Other specified noninflammatory disorders of vulva and perineum: Secondary | ICD-10-CM | POA: Diagnosis present

## 2020-03-02 DIAGNOSIS — Z113 Encounter for screening for infections with a predominantly sexual mode of transmission: Secondary | ICD-10-CM | POA: Diagnosis not present

## 2020-03-02 DIAGNOSIS — Z30433 Encounter for removal and reinsertion of intrauterine contraceptive device: Secondary | ICD-10-CM | POA: Diagnosis not present

## 2020-03-02 DIAGNOSIS — Z01419 Encounter for gynecological examination (general) (routine) without abnormal findings: Secondary | ICD-10-CM

## 2020-03-02 DIAGNOSIS — N898 Other specified noninflammatory disorders of vagina: Secondary | ICD-10-CM | POA: Insufficient documentation

## 2020-03-02 LAB — POCT URINE PREGNANCY: Preg Test, Ur: NEGATIVE

## 2020-03-02 NOTE — Progress Notes (Signed)
Subjective:        Kaylee Kelly is a 34 y.o. female here for a routine exam.  Current complaints: Growth on vagina that is increasing in size.  Also complains of vaginal discharge.  Personal health questionnaire:  Is patient Ashkenazi Jewish, have a family history of breast and/or ovarian cancer: no Is there a family history of uterine cancer diagnosed at age < 63, gastrointestinal cancer, urinary tract cancer, family member who is a Personnel officer syndrome-associated carrier: no Is the patient overweight and hypertensive, family history of diabetes, personal history of gestational diabetes, preeclampsia or PCOS: no Is patient over 74, have PCOS,  family history of premature CHD under age 75, diabetes, smoke, have hypertension or peripheral artery disease:  no At any time, has a partner hit, kicked or otherwise hurt or frightened you?: no Over the past 2 weeks, have you felt down, depressed or hopeless?: no Over the past 2 weeks, have you felt little interest or pleasure in doing things?:no   Gynecologic History No LMP recorded. (Menstrual status: IUD). Contraception: IUD Last Pap: 2016. Results were: normal Last mammogram: n/a. Results were: n/a  Obstetric History OB History  Gravida Para Term Preterm AB Living  2 2 2     2   SAB TAB Ectopic Multiple Live Births        0 2    # Outcome Date GA Lbr Len/2nd Weight Sex Delivery Anes PTL Lv  2 Term 01/21/15 [redacted]w[redacted]d  8 lb 6.4 oz (3.81 kg) M CS-LTranv Spinal  LIV  1 Term 04/16/10 [redacted]w[redacted]d  7 lb (3.175 kg) F CS-LTranv EPI  LIV    Past Medical History:  Diagnosis Date  . Anemia   . Headache     Past Surgical History:  Procedure Laterality Date  . APPENDECTOMY    . CESAREAN SECTION    . CESAREAN SECTION N/A 01/21/2015   Procedure: CESAREAN SECTION;  Surgeon: 03/24/2015, MD;  Location: [facility];  Service: Obstetrics;  Laterality: N/A;     Current Outpatient Medications:  .  acetaminophen (TYLENOL) 325 MG tablet, Take 2 tablets (650 mg  total) by mouth every 6 (six) hours as needed. (Patient not taking: Reported on 03/02/2020), Disp: 30 tablet, Rfl: 0 .  diphenhydrAMINE (BENADRYL) 25 MG tablet, Take 1 tablet (25 mg total) by mouth every 6 (six) hours for 4 days., Disp: 16 tablet, Rfl: 0 .  famotidine (PEPCID) 20 MG tablet, Take 1 tablet (20 mg total) by mouth 2 (two) times daily for 4 days., Disp: 8 tablet, Rfl: 0 .  fluticasone (FLONASE) 50 MCG/ACT nasal spray, Place 2 sprays into both nostrils daily. (Patient not taking: Reported on 01/03/2018), Disp: 16 g, Rfl: 0 .  ibuprofen (ADVIL,MOTRIN) 800 MG tablet, Take 1 tablet (800 mg total) by mouth every 8 (eight) hours as needed. (Patient not taking: Reported on 01/03/2018), Disp: 21 tablet, Rfl: 0 .  mometasone (NASONEX) 50 MCG/ACT nasal spray, Place 2 sprays into the nose daily. (Patient not taking: Reported on 01/03/2018), Disp: 17 g, Rfl: 0 .  naproxen (NAPROSYN) 500 MG tablet, Take 1 tablet (500 mg total) by mouth 2 (two) times daily with a meal. (Patient not taking: Reported on 03/02/2020), Disp: 20 tablet, Rfl: 0 .  phenol (CHLORASEPTIC) 1.4 % LIQD, Use as directed 1 spray in the mouth or throat as needed for throat irritation / pain. (Patient not taking: Reported on 01/03/2018), Disp: 1 Bottle, Rfl: 0 .  Prenatal Multivit-Min-Fe-FA (PRENATAL VITAMINS) 0.8 MG tablet,  Take 1 tablet by mouth daily. (Patient not taking: Reported on 01/03/2018), Disp: 30 tablet, Rfl: 12 .  traMADol (ULTRAM) 50 MG tablet, Take 1 tablet (50 mg total) by mouth every 6 (six) hours as needed for severe pain. (Patient not taking: Reported on 01/03/2018), Disp: 15 tablet, Rfl: 0 No Known Allergies  Social History   Tobacco Use  . Smoking status: Never Smoker  . Smokeless tobacco: Never Used  Substance Use Topics  . Alcohol use: Yes    Comment: socially; none now with preg     Family History  Problem Relation Age of Onset  . Hyperlipidemia Father   . Diabetes Maternal Grandmother       Review of  Systems  Constitutional: negative for fatigue and weight loss Respiratory: negative for cough and wheezing Cardiovascular: negative for chest pain, fatigue and palpitations Gastrointestinal: negative for abdominal pain and change in bowel habits Musculoskeletal:negative for myalgias Neurological: negative for gait problems and tremors Behavioral/Psych: negative for abusive relationship, depression Endocrine: negative for temperature intolerance    Genitourinary:negative for abnormal menstrual periods, genital lesions, hot flashes, sexual problems.  Positive for vaginal discharge and growth on vulva Integument/breast: negative for breast lump, breast tenderness, nipple discharge and skin lesion(s)    Objective:       BP 105/69   Pulse 80   Wt 134 lb 4.8 oz (60.9 kg)   BMI 23.05 kg/m  General:   alert  Skin:   no rash or abnormalities  Lungs:   clear to auscultation bilaterally  Heart:   regular rate and rhythm, S1, S2 normal, no murmur, click, rub or gallop  Breasts:   normal without suspicious masses, skin or nipple changes or axillary nodes  Abdomen:  normal findings: no organomegaly, soft, non-tender and no hernia  Pelvis:  External genitalia: 2cm skin tag on left vulva - resected ( see note) Urinary system: urethral meatus normal and bladder without fullness, nontender Vaginal: normal without tenderness, induration or masses Cervix: normal appearance Adnexa: normal bimanual exam Uterus: anteverted and non-tender, normal size   Procedure Note: Resection of Left Vulva Skin Tag         Written consent obtained.  The area prepped with Betadine x 3.  The base of the skin tag anesthetized with 2% Xylocaine / Epinephrine solution.  The skin tag excised at its base with a #10 scalpel.  The incision closed with 4-0 Vicryl.  EBL negligible.  Patient tolerated procedure well.  Brock Bad, MD 03/02/2020   Lab Review Urine pregnancy test Labs reviewed yes Radiologic studies  reviewed no  50% of 25 min visit spent on counseling and coordination of care.   Assessment:     1. Encounter for routine gynecological examination with Papanicolaou smear of cervix Rx: - Cytology - PAP( [place])  2. Vaginal discharge Rx: - POCT urine pregnancy - Cervicovaginal ancillary only( [place])  3. Screen for STD (sexually transmitted disease) Rx: - Hepatitis B surface antigen - Hepatitis C antibody - HIV Antibody (routine testing w rflx) - RPR  4. Skin tag of vulva Rx: - Surgical pathology( / [facility])    Plan:    Education reviewed: calcium supplements, depression evaluation, low fat, low cholesterol diet, safe sex/STD prevention, self breast exams and weight bearing exercise. Contraception: IUD. Follow up in: 1 week.    Orders Placed This Encounter  Procedures  . Hepatitis B surface antigen  . Hepatitis C antibody  . HIV Antibody (routine testing w rflx)  .  RPR  . POCT urine pregnancy     Brock Bad, MD 03/02/2020 1:55 PM

## 2020-03-02 NOTE — Progress Notes (Signed)
Pt presents for Mirena removal and reinsertion but needs an annual for insurance purposes IUD inserted 03/13/2015 Normal pap 09/2014  Pt c/o skin growth on labia Requests all STD testing

## 2020-03-03 LAB — HEPATITIS C ANTIBODY: Hep C Virus Ab: <0.1 s/co ratio (ref 0.0–0.9)

## 2020-03-03 LAB — RPR: RPR Ser Ql: NONREACTIVE

## 2020-03-03 LAB — HEPATITIS B SURFACE ANTIGEN: Hepatitis B Surface Ag: NEGATIVE

## 2020-03-03 LAB — HIV ANTIBODY (ROUTINE TESTING W REFLEX): HIV Screen 4th Generation wRfx: NONREACTIVE

## 2020-03-05 ENCOUNTER — Other Ambulatory Visit: Payer: Self-pay | Admitting: Obstetrics

## 2020-03-05 DIAGNOSIS — B9689 Other specified bacterial agents as the cause of diseases classified elsewhere: Secondary | ICD-10-CM

## 2020-03-05 LAB — SURGICAL PATHOLOGY

## 2020-03-05 LAB — CERVICOVAGINAL ANCILLARY ONLY
Bacterial Vaginitis (gardnerella): POSITIVE — AB
Candida Glabrata: NEGATIVE
Candida Vaginitis: NEGATIVE
Chlamydia: NEGATIVE
Comment: NEGATIVE
Comment: NEGATIVE
Comment: NEGATIVE
Comment: NEGATIVE
Comment: NEGATIVE
Comment: NORMAL
Neisseria Gonorrhea: NEGATIVE
Trichomonas: NEGATIVE

## 2020-03-05 MED ORDER — METRONIDAZOLE 500 MG PO TABS: 500.0000 mg | ORAL_TABLET | Freq: Two times a day (BID) | ORAL | 2 refills | Status: DC

## 2020-03-06 LAB — CYTOLOGY - PAP
Comment: NEGATIVE
Diagnosis: NEGATIVE
High risk HPV: NEGATIVE

## 2020-03-13 ENCOUNTER — Encounter: Payer: Self-pay | Admitting: Obstetrics

## 2020-03-13 ENCOUNTER — Ambulatory Visit (INDEPENDENT_AMBULATORY_CARE_PROVIDER_SITE_OTHER): Payer: 59 | Admitting: Obstetrics

## 2020-03-13 ENCOUNTER — Other Ambulatory Visit: Payer: Self-pay

## 2020-03-13 VITALS — BP 95/65 | HR 68 | Wt 132.0 lb

## 2020-03-13 DIAGNOSIS — Z30433 Encounter for removal and reinsertion of intrauterine contraceptive device: Secondary | ICD-10-CM | POA: Diagnosis not present

## 2020-03-13 MED ORDER — LEVONORGESTREL 20 MCG/24HR IU IUD: INTRAUTERINE_SYSTEM | Freq: Once | INTRAUTERINE | Status: AC

## 2020-03-13 NOTE — Progress Notes (Addendum)
    GYNECOLOGY OFFICE PROCEDURE NOTE  Kaylee Kelly is a 34 y.o. [id number] here for MIRENA IUD Removal and Reinsertion. No GYN concerns.  Last pap smear was on 03-02-2020 and was normal.  IUD Removal and Reinsertion  Patient identified, informed consent performed, consent signed.   Discussed risks of irregular bleeding, cramping, infection, malpositioning or misplacement of the IUD outside the uterus which may require further procedures. Also discussed >99% contraception efficacy, increased risk of ectopic pregnancy with failure of method.   Emphasized that this did not protect against STIs, condoms recommended during all sexual encounters.Advised to use backup contraception for one week as the risk of pregnancy is higher during the transition period of removing an IUD and replacing it with another one. Time out was performed. Speculum placed in the vagina. The strings of the IUD were grasped and pulled using ring forceps. The IUD was successfully removed in its entirety. The cervix was cleaned with Betadine x 2 and grasped anteriorly with a single tooth tenaculum.  The new MIRENA  IUD insertion apparatus was used to sound the uterus to 6 cm;  the IUD was then placed per manufacturer's recommendations. Strings trimmed to 3 cm. Tenaculum was removed, good hemostasis noted. Patient tolerated procedure well.   IUD:  Mirena Lot #:[id number] Exp:  SEP  2023  Patient was given post-procedure instructions.  She was reminded to have backup contraception for one week during this transition period between IUDs.  Patient was also asked to check IUD strings periodically and follow up in 4 weeks for IUD check.   Brock Bad, MD, FACOG Obstetrician & Gynecologist, [facility], [facility] 03/13/20

## 2020-03-15 ENCOUNTER — Ambulatory Visit: Payer: 59 | Admitting: Sports Medicine

## 2020-03-19 ENCOUNTER — Encounter: Payer: Self-pay | Admitting: Podiatry

## 2020-03-19 ENCOUNTER — Ambulatory Visit (INDEPENDENT_AMBULATORY_CARE_PROVIDER_SITE_OTHER): Payer: 59 | Admitting: Podiatry

## 2020-03-19 ENCOUNTER — Other Ambulatory Visit: Payer: Self-pay

## 2020-03-19 VITALS — BP 100/64 | HR 71 | Temp 97.4°F | Resp 16

## 2020-03-19 DIAGNOSIS — B351 Tinea unguium: Secondary | ICD-10-CM

## 2020-03-19 DIAGNOSIS — L600 Ingrowing nail: Secondary | ICD-10-CM

## 2020-03-19 MED ORDER — TERBINAFINE HCL 250 MG PO TABS: 250.0000 mg | ORAL_TABLET | Freq: Every day | ORAL | 0 refills | Status: DC

## 2020-03-19 NOTE — Patient Instructions (Signed)

## 2020-03-19 NOTE — Progress Notes (Signed)
Subjective:   Patient ID: Kaylee Kelly, female   DOB: 34 y.o.   MRN: [id number]   HPI Patient states that she has had a thickened big toenail right for a number of years and she has done a very short treatment of oral several years ago and then stopped.  She is interested in treatment and is hoping to be able to save her toenail.  Patient does not smoke likes to be active   Review of Systems  All other systems reviewed and are negative.       Objective:  Physical Exam Vitals and nursing note reviewed.  Constitutional:      Appearance: She is well-developed.  Pulmonary:     Effort: Pulmonary effort is normal.  Musculoskeletal:        General: Normal range of motion.  Skin:    General: Skin is warm.  Neurological:     Mental Status: She is alert.     Neurovascular status intact muscle strength found to be adequate range of motion within normal limits.  Patient is noted to have a very thickened right big toenail that gets tender at times and has been this way for a number of years.  Patient has good digital perfusion well oriented x3 and does have some slight skin irritation and several nails that have some discoloration     Assessment:  Probability that this is related to trauma with the possibility that there may be an underlying mycotic infection     Plan:  H&P condition reviewed explained.  At this point we are going to remove the nail allowing new nail to regrow start her on oral antifungal agent and I instructed her that there is a good chance this nail will not grow normally ultimately require permanent procedure.  Patient is willing to accept this and wants to try this instead of permanent procedure and today I infiltrated the right hallux 60 mg Xylocaine Marcaine mixture sterile prep applied to the toe and using sterile instrumentation I remove the hallux nail cleaned up the bed applied sterile dressing and instructed on soaks and we will start oral Lamisil for 90 days and  did order liver function studies today.  I encouraged her to call with questions concerns and did discuss with her the risk associated with oral antifungal therapy and the fact this may not cure her problem

## 2020-04-24 ENCOUNTER — Ambulatory Visit: Payer: 59 | Admitting: Obstetrics

## 2020-07-03 ENCOUNTER — Other Ambulatory Visit: Payer: Self-pay

## 2020-07-03 ENCOUNTER — Emergency Department (HOSPITAL_COMMUNITY)
Admission: EM | Admit: 2020-07-03 | Discharge: 2020-07-03 | Disposition: A | Discharge status: Home / Self Care | Payer: 59 | Attending: Emergency Medicine | Admitting: Emergency Medicine

## 2020-07-03 ENCOUNTER — Encounter (HOSPITAL_COMMUNITY): Payer: Self-pay | Admitting: Emergency Medicine

## 2020-07-03 DIAGNOSIS — Z5321 Procedure and treatment not carried out due to patient leaving prior to being seen by health care provider: Secondary | ICD-10-CM | POA: Insufficient documentation

## 2020-07-03 DIAGNOSIS — M542 Cervicalgia: Secondary | ICD-10-CM | POA: Insufficient documentation

## 2020-07-03 DIAGNOSIS — M549 Dorsalgia, unspecified: Secondary | ICD-10-CM | POA: Insufficient documentation

## 2020-07-03 DIAGNOSIS — R070 Pain in throat: Secondary | ICD-10-CM | POA: Insufficient documentation

## 2020-07-03 DIAGNOSIS — R519 Headache, unspecified: Secondary | ICD-10-CM | POA: Insufficient documentation

## 2020-07-03 NOTE — ED Notes (Signed)
Pt called back to room no answer °
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
## 2020-10-23 ENCOUNTER — Ambulatory Visit: Payer: Self-pay

## 2020-10-26 ENCOUNTER — Other Ambulatory Visit: Payer: Self-pay

## 2020-10-26 ENCOUNTER — Ambulatory Visit (INDEPENDENT_AMBULATORY_CARE_PROVIDER_SITE_OTHER): Payer: Self-pay

## 2020-10-26 ENCOUNTER — Other Ambulatory Visit (HOSPITAL_COMMUNITY)
Admission: RE | Admit: 2020-10-26 | Discharge: 2020-10-26 | Disposition: A | Discharge status: Home / Self Care | Payer: No Typology Code available for payment source | Source: Ambulatory Visit | Attending: Obstetrics | Admitting: Obstetrics

## 2020-10-26 DIAGNOSIS — Z113 Encounter for screening for infections with a predominantly sexual mode of transmission: Secondary | ICD-10-CM

## 2020-10-26 DIAGNOSIS — N898 Other specified noninflammatory disorders of vagina: Secondary | ICD-10-CM

## 2020-10-26 NOTE — Progress Notes (Signed)
SUBJECTIVE:  35 y.o. female complains of vaginal odor and vulva itching. Denies abnormal vaginal bleeding or significant pelvic pain or fever. No UTI symptoms. Denies history of known exposure to STD. Pt requests all STD testing.   LMP - IUD  OBJECTIVE:  She appears well, afebrile. Urine dipstick: not done.  ASSESSMENT:  Vaginal Discharge  Vaginal Odor   PLAN:  GC, chlamydia, trichomonas, BVAG, CVAG probe sent to lab. Treatment: To be determined once lab results are reviewed by the provider  ROV prn if symptoms persist or worsen.

## 2020-10-26 NOTE — Progress Notes (Signed)
Agree with A & P. 

## 2020-10-27 LAB — HEPATITIS C ANTIBODY: Hep C Virus Ab: <0.1 s/co ratio (ref 0.0–0.9)

## 2020-10-27 LAB — RPR: RPR Ser Ql: NONREACTIVE

## 2020-10-27 LAB — HEPATITIS B SURFACE ANTIGEN: Hepatitis B Surface Ag: NEGATIVE

## 2020-10-27 LAB — HIV ANTIBODY (ROUTINE TESTING W REFLEX): HIV Screen 4th Generation wRfx: NONREACTIVE

## 2020-10-29 LAB — CERVICOVAGINAL ANCILLARY ONLY
Bacterial Vaginitis (gardnerella): POSITIVE — AB
Candida Glabrata: NEGATIVE
Candida Vaginitis: NEGATIVE
Chlamydia: NEGATIVE
Comment: NEGATIVE
Comment: NEGATIVE
Comment: NEGATIVE
Comment: NEGATIVE
Comment: NEGATIVE
Comment: NORMAL
Neisseria Gonorrhea: NEGATIVE
Trichomonas: NEGATIVE

## 2020-10-30 ENCOUNTER — Telehealth: Payer: Self-pay

## 2020-10-30 MED ORDER — METRONIDAZOLE 500 MG PO TABS: 500.0000 mg | ORAL_TABLET | Freq: Two times a day (BID) | ORAL | 0 refills | Status: AC

## 2020-10-30 NOTE — Telephone Encounter (Signed)
-----   Message from Hermina Staggers, MD sent at 10/30/2020  9:36 AM EDT ----- Please sent Rx for Flagyl 500 mg po bid x7 days for BV Pt is aware.  Thanks Casimiro Needle

## 2020-10-30 NOTE — Telephone Encounter (Signed)
Called patient to inform her of test results. LMTCB Rx sent to her pharmacy on file.

## 2020-11-20 ENCOUNTER — Emergency Department (HOSPITAL_COMMUNITY)
Admission: EM | Admit: 2020-11-20 | Discharge: 2020-11-21 | Disposition: A | Discharge status: Home / Self Care | Payer: Self-pay | Attending: Emergency Medicine | Admitting: Emergency Medicine

## 2020-11-20 ENCOUNTER — Other Ambulatory Visit: Payer: Self-pay

## 2020-11-20 DIAGNOSIS — Z28310 Unvaccinated for covid-19: Secondary | ICD-10-CM | POA: Insufficient documentation

## 2020-11-20 DIAGNOSIS — Z20822 Contact with and (suspected) exposure to covid-19: Secondary | ICD-10-CM | POA: Insufficient documentation

## 2020-11-20 DIAGNOSIS — J069 Acute upper respiratory infection, unspecified: Secondary | ICD-10-CM | POA: Insufficient documentation

## 2020-11-20 DIAGNOSIS — R1110 Vomiting, unspecified: Secondary | ICD-10-CM | POA: Insufficient documentation

## 2020-11-20 DIAGNOSIS — R197 Diarrhea, unspecified: Secondary | ICD-10-CM | POA: Insufficient documentation

## 2020-11-20 NOTE — ED Provider Notes (Signed)
Emergency Medicine Provider Triage Evaluation Note  Kaylee Kelly , a 35 y.o. female  was evaluated in triage.  Pt complains of rhinorrhea, congestion, sore throat, vomiting, diarrhea, fevers, generalized weakness and lightheadedness. Had a direct covid exposure at work. Is not vaccinated  Review of Systems  Positive: rhinorrhea, congestion, sore throat, vomiting, fevers, generalized weakness, diarrhea Negative: dysuria  Physical Exam  BP 110/82 (BP Location: Right Arm)   Pulse (!) 106   Temp 98.9 F (37.2 C) (Oral)   Resp 17   SpO2 98%  Gen:   Awake, no distress   Resp:  Normal effort  MSK:   Moves extremities without difficulty  Other:  abd nontender  Medical Decision Making  Medically screening exam initiated at 11:53 PM.  Appropriate orders placed.  Kaylee Kelly was informed that the remainder of the evaluation will be completed by another provider, this initial triage assessment does not replace that evaluation, and the importance of remaining in the ED until their evaluation is complete.    Karrie Meres, PA-C 11/20/20 2356    Nira Conn, MD 11/21/20 [phone]

## 2020-11-20 NOTE — ED Triage Notes (Signed)
Pt reports been sick for a while and thought it was allergies. Pt reports s/s of symptoms, runny nose, congestion, sore throat, weakness, n/v/d

## 2020-11-21 ENCOUNTER — Emergency Department (HOSPITAL_COMMUNITY): Payer: Self-pay

## 2020-11-21 LAB — I-STAT BETA HCG BLOOD, ED (MC, WL, AP ONLY): I-stat hCG, quantitative: <5 m[IU]/mL (ref <5)

## 2020-11-21 LAB — CBC WITH DIFFERENTIAL/PLATELET
Abs Immature Granulocytes: 0.04 10*3/uL (ref 0.00–0.07)
Basophils Absolute: 0.1 10*3/uL (ref 0.0–0.1)
Basophils Relative: 1 %
Eosinophils Absolute: 0.8 10*3/uL — ABNORMAL HIGH (ref 0.0–0.5)
Eosinophils Relative: 7 %
HCT: 38.5 % (ref 36.0–46.0)
Hemoglobin: 12.8 g/dL (ref 12.0–15.0)
Immature Granulocytes: 0 %
Lymphocytes Relative: 13 %
Lymphs Abs: 1.4 10*3/uL (ref 0.7–4.0)
MCH: 30.6 pg (ref 26.0–34.0)
MCHC: 33.2 g/dL (ref 30.0–36.0)
MCV: 92.1 fL (ref 80.0–100.0)
Monocytes Absolute: 0.7 10*3/uL (ref 0.1–1.0)
Monocytes Relative: 6 %
Neutro Abs: 8 10*3/uL — ABNORMAL HIGH (ref 1.7–7.7)
Neutrophils Relative %: 73 %
Platelets: 213 10*3/uL (ref 150–400)
RBC: 4.18 MIL/uL (ref 3.87–5.11)
RDW: 12.7 % (ref 11.5–15.5)
WBC: 11 10*3/uL — ABNORMAL HIGH (ref 4.0–10.5)
nRBC: 0 % (ref 0.0–0.2)

## 2020-11-21 LAB — COMPREHENSIVE METABOLIC PANEL
ALT: 20 U/L (ref 0–44)
AST: 22 U/L (ref 15–41)
Albumin: 3.8 g/dL (ref 3.5–5.0)
Alkaline Phosphatase: 75 U/L (ref 38–126)
Anion gap: 7 (ref 5–15)
BUN: 6 mg/dL (ref 6–20)
CO2: 26 mmol/L (ref 22–32)
Calcium: 9.1 mg/dL (ref 8.9–10.3)
Chloride: 105 mmol/L (ref 98–111)
Creatinine, Ser: 0.53 mg/dL (ref 0.44–1.00)
GFR, Estimated: >60 mL/min (ref >60)
Glucose, Bld: 113 mg/dL — ABNORMAL HIGH (ref 70–99)
Potassium: 3.4 mmol/L — ABNORMAL LOW (ref 3.5–5.1)
Sodium: 138 mmol/L (ref 135–145)
Total Bilirubin: 0.8 mg/dL (ref 0.3–1.2)
Total Protein: 7.1 g/dL (ref 6.5–8.1)

## 2020-11-21 LAB — LIPASE, BLOOD: Lipase: 37 U/L (ref 11–51)

## 2020-11-21 LAB — RESP PANEL BY RT-PCR (FLU A&B, COVID) ARPGX2
Influenza A by PCR: NEGATIVE
Influenza B by PCR: NEGATIVE
SARS Coronavirus 2 by RT PCR: NEGATIVE

## 2020-11-21 LAB — GROUP A STREP BY PCR: Group A Strep by PCR: NOT DETECTED

## 2020-11-21 MED ORDER — GUAIFENESIN ER 600 MG PO TB12: 600.0000 mg | ORAL_TABLET | Freq: Two times a day (BID) | ORAL | 0 refills | Status: DC | PRN

## 2020-11-21 MED ORDER — ONDANSETRON HCL 4 MG PO TABS: 4.0000 mg | ORAL_TABLET | Freq: Four times a day (QID) | ORAL | 0 refills | Status: AC

## 2020-11-21 MED ORDER — BENZONATATE 100 MG PO CAPS: 100.0000 mg | ORAL_CAPSULE | Freq: Three times a day (TID) | ORAL | 0 refills | Status: AC

## 2020-11-21 MED ORDER — GUAIFENESIN ER 600 MG PO TB12: 600.0000 mg | ORAL_TABLET | Freq: Two times a day (BID) | ORAL | 0 refills | Status: AC | PRN

## 2020-11-21 NOTE — Discharge Instructions (Addendum)
Your covid, flu and strep tests were negative. Your chest xray did not show pneumonia. The remainder of your labs were reassuring. I suspect you have a viral infection.   You were given a prescription for zofran to help with your nausea. Please take as directed.  Take tessalon for cough and mucinex for congestion.   Please follow up with your primary care provider within 5-7 days for re-evaluation of your symptoms. If you do not have a primary care provider, information for a healthcare clinic has been provided for you to make arrangements for follow up care. Please return to the emergency department for any new or worsening symptoms.

## 2020-11-21 NOTE — ED Notes (Signed)
Patient verbalizes understanding of discharge instructions. Opportunity for questioning and answers were provided. Armband removed by staff, pt discharged from ED ambulatory.   

## 2020-11-21 NOTE — ED Provider Notes (Signed)
[facility] EMERGENCY DEPARTMENT Provider Note   CSN: [id number] Arrival date & time: 11/20/20  2316     History Chief Complaint  Patient presents with  . Fatigue    Kaylee Kelly is a 35 y.o. female.  HPI   35 y/o female with a h/o anemia, ha, who presents to the ED today for eval of mult complaints. Pt states she has had rhinorrhea, congestion, post nasal drip, sore throat, cough, vomiting, diarrhea, fevers and generalized weakness for 1 week. She had a direct covid exposure at work recently. She has not been vaccinated. Denies exacerbating or alleviating factors. sxs have been constant and have not improved since onset.   Past Medical History:  Diagnosis Date  . Anemia   . Headache     There are no problems to display for this patient.   Past Surgical History:  Procedure Laterality Date  . APPENDECTOMY    . CESAREAN SECTION    . CESAREAN SECTION N/A 01/21/2015   Procedure: CESAREAN SECTION;  Surgeon: Tereso Newcomer, MD;  Location: [facility];  Service: Obstetrics;  Laterality: N/A;     OB History    Gravida  2   Para  2   Term  2   Preterm      AB      Living  2     SAB      IAB      Ectopic      Multiple  0   Live Births  2           Family History  Problem Relation Age of Onset  . Hyperlipidemia Father   . Diabetes Maternal Grandmother     Social History   Tobacco Use  . Smoking status: Never Smoker  . Smokeless tobacco: Never Used  Vaping Use  . Vaping Use: Never used  Substance Use Topics  . Alcohol use: Yes    Comment: socially; none now with preg   . Drug use: No    Home Medications Prior to Admission medications   Medication Sig Start Date End Date Taking? Authorizing Provider  acetaminophen (TYLENOL) 500 MG tablet Take 500 mg by mouth every 6 (six) hours as needed for moderate pain or headache.   Yes [provider]  benzonatate (TESSALON) 100 MG capsule Take 1 capsule (100 mg total) by mouth  every 8 (eight) hours for 5 days. 11/21/20 11/26/20 Yes Tavares Levinson S, PA-C  ondansetron (ZOFRAN) 4 MG tablet Take 1 tablet (4 mg total) by mouth every 6 (six) hours. 11/21/20  Yes Aurore Redinger S, PA-C  guaiFENesin (MUCINEX) 600 MG 12 hr tablet Take 1 tablet (600 mg total) by mouth every 12 (twelve) hours as needed. 11/21/20   Nesreen Albano S, PA-C  metroNIDAZOLE (FLAGYL) 500 MG tablet Take 1 tablet (500 mg total) by mouth 2 (two) times daily. Patient not taking: Reported on 11/21/2020 10/30/20   Hermina Staggers, MD    Allergies    Patient has no known allergies.  Review of Systems   Review of Systems  Constitutional: Positive for chills and fever.  HENT: Positive for congestion, postnasal drip, rhinorrhea and sore throat. Negative for ear pain.   Eyes: Negative for pain and visual disturbance.  Respiratory: Positive for cough.   Cardiovascular: Negative for chest pain.  Gastrointestinal: Positive for diarrhea, nausea and vomiting. Negative for abdominal pain.  Genitourinary: Negative for dysuria and hematuria.  Musculoskeletal: Negative for back pain.  Skin: Negative for  rash.  Neurological: Positive for headaches.  All other systems reviewed and are negative.   Physical Exam Updated Vital Signs BP 103/60   Pulse 88   Temp 98.9 F (37.2 C) (Oral)   Resp (!) 25   SpO2 100%   Physical Exam Vitals and nursing note reviewed.  Constitutional:      General: She is not in acute distress.    Appearance: She is well-developed.  HENT:     Head: Normocephalic and atraumatic.     Nose: Congestion present.     Mouth/Throat:     Mouth: Mucous membranes are moist.     Pharynx: Posterior oropharyngeal erythema present. No oropharyngeal exudate.     Tonsils: No tonsillar exudate or tonsillar abscesses. 2+ on the right. 2+ on the left.  Eyes:     Conjunctiva/sclera: Conjunctivae normal.  Cardiovascular:     Rate and Rhythm: Normal rate and regular rhythm.     Heart sounds: Normal  heart sounds. No murmur heard.   Pulmonary:     Effort: Pulmonary effort is normal. No respiratory distress.     Breath sounds: Normal breath sounds. No wheezing, rhonchi or rales.  Abdominal:     Palpations: Abdomen is soft.     Tenderness: There is no abdominal tenderness. There is no guarding or rebound.  Musculoskeletal:     Cervical back: Neck supple.  Skin:    General: Skin is warm and dry.  Neurological:     Mental Status: She is alert.     ED Results / Procedures / Treatments   Labs (all labs ordered are listed, but only abnormal results are displayed) Labs Reviewed  COMPREHENSIVE METABOLIC PANEL - Abnormal; Notable for the following components:      Result Value   Potassium 3.4 (*)    Glucose, Bld 113 (*)    All other components within normal limits  CBC WITH DIFFERENTIAL/PLATELET - Abnormal; Notable for the following components:   WBC 11.0 (*)    Neutro Abs 8.0 (*)    Eosinophils Absolute 0.8 (*)    All other components within normal limits  RESP PANEL BY RT-PCR (FLU A&B, COVID) ARPGX2  GROUP A STREP BY PCR  LIPASE, BLOOD  I-STAT BETA HCG BLOOD, ED ([facility], [facility], [facility] ONLY)    EKG None  Radiology DG Chest Portable 1 View  Result Date: 11/21/2020 CLINICAL DATA:  Runny nose with congestion and sore throat. EXAM: PORTABLE CHEST 1 VIEW COMPARISON:  December 24, 2017 FINDINGS: Mildly decreased lung volumes are seen which is likely secondary to the degree of patient inspiration. There is no evidence of acute infiltrate, pleural effusion or pneumothorax. The heart size and mediastinal contours are within normal limits. The visualized skeletal structures are unremarkable. IMPRESSION: No active cardiopulmonary disease. Electronically Signed   By: Aram Candela M.D.   On: 11/21/2020 03:57    Procedures Procedures   Medications Ordered in ED Medications - No data to display  ED Course  I have reviewed the triage vital signs and the nursing notes.  Pertinent labs & imaging  results that were available during my care of the patient were reviewed by me and considered in my medical decision making (see chart for details).    MDM Rules/Calculators/A&P                          Pt here with uri sxs and nvd. Labs wnl, covid/flu/strep neg. CXR negative for acute infiltrate. Patients  symptoms are consistent with URI, likely viral etiology. Discussed that antibiotics are not indicated for viral infections. Pt will be discharged with symptomatic treatment.  Verbalizes understanding and is agreeable with plan. Pt is hemodynamically stable & in NAD prior to dc.   Final Clinical Impression(s) / ED Diagnoses Final diagnoses:  Upper respiratory tract infection, unspecified type    Rx / DC Orders ED Discharge Orders         Ordered    ondansetron (ZOFRAN) 4 MG tablet  Every 6 hours        11/21/20 0429    benzonatate (TESSALON) 100 MG capsule  Every 8 hours        11/21/20 0429    guaiFENesin (MUCINEX) 600 MG 12 hr tablet  Every 12 hours PRN,   Status:  Discontinued        11/21/20 0429    guaiFENesin (MUCINEX) 600 MG 12 hr tablet  Every 12 hours PRN        11/21/20 0430           Janett Kamath, [employer], PA-C 11/21/20 0431    Nira Conn, MD 11/21/20 [phone]

## 2021-01-02 ENCOUNTER — Emergency Department (HOSPITAL_COMMUNITY): Payer: BLUE CROSS/BLUE SHIELD

## 2021-01-02 ENCOUNTER — Encounter (HOSPITAL_COMMUNITY): Payer: Self-pay | Admitting: Pharmacy Technician

## 2021-01-02 ENCOUNTER — Emergency Department (HOSPITAL_COMMUNITY)
Admission: EM | Admit: 2021-01-02 | Discharge: 2021-01-03 | Disposition: A | Discharge status: Home / Self Care | Payer: BLUE CROSS/BLUE SHIELD | Attending: Emergency Medicine | Admitting: Emergency Medicine

## 2021-01-02 DIAGNOSIS — Z20822 Contact with and (suspected) exposure to covid-19: Secondary | ICD-10-CM | POA: Diagnosis not present

## 2021-01-02 DIAGNOSIS — R531 Weakness: Secondary | ICD-10-CM | POA: Diagnosis not present

## 2021-01-02 DIAGNOSIS — R5383 Other fatigue: Secondary | ICD-10-CM | POA: Diagnosis present

## 2021-01-02 LAB — CBC WITH DIFFERENTIAL/PLATELET
Abs Immature Granulocytes: 0.01 10*3/uL (ref 0.00–0.07)
Basophils Absolute: 0.1 10*3/uL (ref 0.0–0.1)
Basophils Relative: 1 %
Eosinophils Absolute: 0.6 10*3/uL — ABNORMAL HIGH (ref 0.0–0.5)
Eosinophils Relative: 7 %
HCT: 37.7 % (ref 36.0–46.0)
Hemoglobin: 12.6 g/dL (ref 12.0–15.0)
Immature Granulocytes: 0 %
Lymphocytes Relative: 28 %
Lymphs Abs: 2.3 10*3/uL (ref 0.7–4.0)
MCH: 30.9 pg (ref 26.0–34.0)
MCHC: 33.4 g/dL (ref 30.0–36.0)
MCV: 92.4 fL (ref 80.0–100.0)
Monocytes Absolute: 0.6 10*3/uL (ref 0.1–1.0)
Monocytes Relative: 7 %
Neutro Abs: 4.7 10*3/uL (ref 1.7–7.7)
Neutrophils Relative %: 57 %
Platelets: 236 10*3/uL (ref 150–400)
RBC: 4.08 MIL/uL (ref 3.87–5.11)
RDW: 12.8 % (ref 11.5–15.5)
WBC: 8.2 10*3/uL (ref 4.0–10.5)
nRBC: 0 % (ref 0.0–0.2)

## 2021-01-02 LAB — BASIC METABOLIC PANEL
Anion gap: 9 (ref 5–15)
BUN: 9 mg/dL (ref 6–20)
CO2: 24 mmol/L (ref 22–32)
Calcium: 8.6 mg/dL — ABNORMAL LOW (ref 8.9–10.3)
Chloride: 105 mmol/L (ref 98–111)
Creatinine, Ser: 0.53 mg/dL (ref 0.44–1.00)
GFR, Estimated: >60 mL/min (ref >60)
Glucose, Bld: 94 mg/dL (ref 70–99)
Potassium: 3.6 mmol/L (ref 3.5–5.1)
Sodium: 138 mmol/L (ref 135–145)

## 2021-01-02 LAB — RESP PANEL BY RT-PCR (FLU A&B, COVID) ARPGX2
Influenza A by PCR: NEGATIVE
Influenza B by PCR: NEGATIVE
SARS Coronavirus 2 by RT PCR: NEGATIVE

## 2021-01-02 LAB — I-STAT BETA HCG BLOOD, ED (MC, WL, AP ONLY): I-stat hCG, quantitative: <5 m[IU]/mL (ref <5)

## 2021-01-02 MED ORDER — SODIUM CHLORIDE 0.9 % IV BOLUS
1000.0000 mL | Freq: Once | INTRAVENOUS | Status: AC
Start: 2021-01-03 — End: 2021-01-03
  Administered 2021-01-03: 1000 mL via INTRAVENOUS

## 2021-01-02 NOTE — ED Notes (Signed)
Pt waiting outside for room.

## 2021-01-02 NOTE — ED Provider Notes (Signed)
Emergency Medicine Provider Triage Evaluation Note  Celestine Prim , a 35 y.o. female  was evaluated in triage.  Pt complains of generalized weakness and decreased po intake over the past few days. She admits to having difficulty catching her breath. No fever or chills. Denies abdominal pain, nausea, vomiting, and diarrhea. Notes she feels lightheaded when she stands occasionally.   Review of Systems  Positive: Lightheaded, weakness Negative: fever  Physical Exam  BP 105/73   Pulse 73   Temp 98.3 F (36.8 C) (Oral)   Resp 17   SpO2 99%  Gen:   Awake, no distress   Resp:  Normal effort  MSK:   Moves extremities without difficulty  Other:    Medical Decision Making  Medically screening exam initiated at 7:31 PM.  Appropriate orders placed.  Lolamae Voisin was informed that the remainder of the evaluation will be completed by another provider, this initial triage assessment does not replace that evaluation, and the importance of remaining in the ED until their evaluation is complete.  Labs to rule out dehydration and electrolyte abnormalities. CXR due to shortness of breath.    Jesusita Oka 01/02/21 1933    Linwood Dibbles, MD 01/03/21 [phone]

## 2021-01-02 NOTE — ED Triage Notes (Signed)
Pt here with generalized weakness, fatigue, dry mouth, decreased po intake for the last few days. Pt concerned for dehydration.

## 2021-01-02 NOTE — ED Provider Notes (Signed)
[facility] EMERGENCY DEPARTMENT Provider Note   CSN: [id number] Arrival date & time: 01/02/21  1905     History Chief Complaint  Patient presents with   Weakness    Kaylee Kelly is a 35 y.o. female with a hx of anemia & prior appendectomy who presents to the ED with complaints of fatigue for the past couple of weeks that worsened over the past few days. States she feels generally weak & fatigued with poor appetite and lightheadedness with standing and dyspnea when she feels very weak. No other alleviating/aggravating factors. Has had some congestion/allergies. She is concerned she is dehydrated. Denies fever, nausea, vomiting, diarrhea, abdominal pain, chest pain, cough, hemoptysis, leg pain/swelling, or syncope.   HPI     Past Medical History:  Diagnosis Date   Anemia    Headache     There are no problems to display for this patient.   Past Surgical History:  Procedure Laterality Date   APPENDECTOMY     CESAREAN SECTION     CESAREAN SECTION N/A 01/21/2015   Procedure: CESAREAN SECTION;  Surgeon: Tereso Newcomer, MD;  Location: [facility];  Service: Obstetrics;  Laterality: N/A;     OB History     Gravida  2   Para  2   Term  2   Preterm      AB      Living  2      SAB      IAB      Ectopic      Multiple  0   Live Births  2           Family History  Problem Relation Age of Onset   Hyperlipidemia Father    Diabetes Maternal Grandmother     Social History   Tobacco Use   Smoking status: Never   Smokeless tobacco: Never  Vaping Use   Vaping Use: Never used  Substance Use Topics   Alcohol use: Yes    Comment: socially; none now with preg    Drug use: No    Home Medications Prior to Admission medications   Medication Sig Start Date End Date Taking? Authorizing Provider  acetaminophen (TYLENOL) 500 MG tablet Take 500 mg by mouth every 6 (six) hours as needed for moderate pain or headache.    [provider]   guaiFENesin (MUCINEX) 600 MG 12 hr tablet Take 1 tablet (600 mg total) by mouth every 12 (twelve) hours as needed. 11/21/20   Couture, Cortni S, PA-C  metroNIDAZOLE (FLAGYL) 500 MG tablet Take 1 tablet (500 mg total) by mouth 2 (two) times daily. Patient not taking: Reported on 11/21/2020 10/30/20   Hermina Staggers, MD  ondansetron (ZOFRAN) 4 MG tablet Take 1 tablet (4 mg total) by mouth every 6 (six) hours. 11/21/20   Couture, Cortni S, PA-C    Allergies    Patient has no known allergies.  Review of Systems   Review of Systems  Constitutional:  Positive for appetite change and fatigue. Negative for chills and fever.  HENT:  Positive for congestion. Negative for ear pain and sore throat.   Respiratory:  Positive for shortness of breath. Negative for cough.   Cardiovascular:  Negative for chest pain and leg swelling.  Gastrointestinal:  Negative for abdominal pain, diarrhea, nausea and vomiting.  Genitourinary:  Negative for dysuria.  Neurological:  Positive for weakness (generalized) and light-headedness. Negative for syncope.  All other systems reviewed and are negative.  Physical  Exam Updated Vital Signs BP 108/73 (BP Location: Right Arm)   Pulse 68   Temp 98.3 F (36.8 C) (Oral)   Resp 16   SpO2 100%   Physical Exam Vitals and nursing note reviewed.  Constitutional:      General: She is not in acute distress.    Appearance: She is well-developed. She is not toxic-appearing.  HENT:     Head: Normocephalic and atraumatic.  Eyes:     General:        Right eye: No discharge.        Left eye: No discharge.     Conjunctiva/sclera: Conjunctivae normal.  Cardiovascular:     Rate and Rhythm: Normal rate and regular rhythm.  Pulmonary:     Effort: Pulmonary effort is normal. No respiratory distress.     Breath sounds: Normal breath sounds. No wheezing, rhonchi or rales.  Abdominal:     General: There is no distension.     Palpations: Abdomen is soft.     Tenderness: There is no  abdominal tenderness. There is no guarding or rebound.  Musculoskeletal:        General: No tenderness.     Cervical back: Neck supple.     Right lower leg: No edema.     Left lower leg: No edema.  Skin:    General: Skin is warm and dry.     Findings: No rash.  Neurological:     Mental Status: She is alert.     Comments: Clear speech.   Psychiatric:        Behavior: Behavior normal.    ED Results / Procedures / Treatments   Labs (all labs ordered are listed, but only abnormal results are displayed) Labs Reviewed  CBC WITH DIFFERENTIAL/PLATELET - Abnormal; Notable for the following components:      Result Value   Eosinophils Absolute 0.6 (*)    All other components within normal limits  BASIC METABOLIC PANEL - Abnormal; Notable for the following components:   Calcium 8.6 (*)    All other components within normal limits  RESP PANEL BY RT-PCR (FLU A&B, COVID) ARPGX2  I-STAT BETA HCG BLOOD, ED ([facility], [facility], [facility] ONLY)    EKG EKG Interpretation  Date/Time:  Wednesday January 02 2021 22:07:00 EDT Ventricular Rate:  73 PR Interval:  122 QRS Duration: 74 QT Interval:  404 QTC Calculation: 445 R Axis:   40 Text Interpretation: Normal sinus rhythm Possible Left atrial enlargement Borderline ECG When compared to prior, similar appearance. No sTEMI Confirmed by Theda Belfast ([id number]) on 01/03/2021 3:02:56 AM  Radiology DG Chest 2 View  Result Date: 01/02/2021 CLINICAL DATA:  35 year old female with shortness of breath. EXAM: CHEST - 2 VIEW COMPARISON:  Chest radiograph dated 11/21/2020. FINDINGS: Faint density in the left apex may be artifactual and due to superimposition of the patient's hair. Developing infiltrate is less likely but not excluded clinical correlation is recommended. No consolidative changes. There is no pleural effusion pneumothorax. The cardiac silhouette is within limits. No acute osseous pathology. IMPRESSION: Artifact versus less likely developing infiltrate in the left  apex. Electronically Signed   By: Elgie Collard M.D.   On: 01/02/2021 22:57    Procedures Procedures   Medications Ordered in ED Medications - No data to display  ED Course  I have reviewed the triage vital signs and the nursing notes.  Pertinent labs & imaging results that were available during my care of the patient were reviewed by me and  considered in my medical decision making (see chart for details).    MDM Rules/Calculators/A&P                          Patient presents to the ED with complaints of fatigue.  Nontoxic, vitals without significant abnormality. Fairly benign physical exam.   Additional history obtained:  Additional history obtained from chart review & nursing note review.   EKG: Similar to prior.   Lab Tests:  I Ordered, reviewed, and interpreted labs, which included:  CBC: Fairly unremarkable.  BMP: Mild hypocalcemia.  Preg test: Negative RVP: Negative  Imaging Studies ordered:  CXR ordered by triage, I independently reviewed, formal radiology impression shows:  Artifact versus less likely developing infiltrate in the left apex.  ED Course:  Patient presenting with fatigue.  Reassuring ED work-up. Labs without critical anemia, electrolyte derangement or acute renal failure. Preg test negative. COVID/flu- negative. Will add on TSH- pending. CXR w/ artifact vs developing infiltrate- patient with dyspnea, congestion, will cover with doxycycline. She is tolerating PO, improved after fluids, ambulatory. PERC negative- doubt PE. Abdomen is nontender without peritoneal signs. Overall appears appropriate for discharge home with PCP followup.   I discussed results, treatment plan, need for follow-up, and return precautions with the patient. Provided opportunity for questions, patient confirmed understanding and is in agreement with plan.   Portions of this note were generated with Scientist, clinical (histocompatibility and immunogenetics). Dictation errors may occur despite best attempts at  proofreading.  Final Clinical Impression(s) / ED Diagnoses Final diagnoses:  Fatigue, unspecified type    Rx / DC Orders ED Discharge Orders          Ordered    doxycycline (VIBRAMYCIN) 100 MG capsule  2 times daily        01/03/21 0415             Cherly Anderson, PA-C 01/03/21 0415    Tegeler, Canary Brim, MD 01/03/21 [phone]

## 2021-01-03 LAB — TSH: TSH: 7.294 u[IU]/mL — ABNORMAL HIGH (ref 0.350–4.500)

## 2021-01-03 MED ORDER — DOXYCYCLINE HYCLATE 100 MG PO CAPS: 100.0000 mg | ORAL_CAPSULE | Freq: Two times a day (BID) | ORAL | 0 refills | Status: DC

## 2021-01-03 NOTE — Discharge Instructions (Addendum)
You were seen in the ER today for fatigue.  Your labs were reassuring Your chest xray showed a possible pneumonia- please take doxycyline with food twice per day for the next 5 days for this.   We have prescribed you new medication(s) today. Discuss the medications prescribed today with your pharmacist as they can have adverse effects and interactions with your other medicines including over the counter and prescribed medications. Seek medical evaluation if you start to experience new or abnormal symptoms after taking one of these medicines, seek care immediately if you start to experience difficulty breathing, feeling of your throat closing, facial swelling, or rash as these could be indications of a more serious allergic reaction  Stay well hydrated. Follow up with primary care within 3 days.  Return to the ER for any new or worsening symptoms including but not limited to chest pain, increased work of breathing, coughing up blood, fever, passing out, inability to keep fluids down, or any other concerns.

## 2021-01-03 NOTE — ED Notes (Signed)
Pt was provided with fluids for p.o challenge

## 2021-11-22 ENCOUNTER — Encounter (HOSPITAL_COMMUNITY): Payer: Self-pay | Admitting: Emergency Medicine

## 2021-11-22 ENCOUNTER — Emergency Department (HOSPITAL_COMMUNITY)
Admission: EM | Admit: 2021-11-22 | Discharge: 2021-11-22 | Disposition: A | Discharge status: Home / Self Care | Payer: BLUE CROSS/BLUE SHIELD | Attending: Emergency Medicine | Admitting: Emergency Medicine

## 2021-11-22 DIAGNOSIS — J302 Other seasonal allergic rhinitis: Secondary | ICD-10-CM | POA: Insufficient documentation

## 2021-11-22 DIAGNOSIS — R0981 Nasal congestion: Secondary | ICD-10-CM | POA: Diagnosis present

## 2021-11-22 DIAGNOSIS — Z20822 Contact with and (suspected) exposure to covid-19: Secondary | ICD-10-CM | POA: Diagnosis not present

## 2021-11-22 LAB — RESP PANEL BY RT-PCR (FLU A&B, COVID) ARPGX2
Influenza A by PCR: NEGATIVE
Influenza B by PCR: NEGATIVE
SARS Coronavirus 2 by RT PCR: NEGATIVE

## 2021-11-22 MED ORDER — MOMETASONE FUROATE 50 MCG/ACT NA SUSP: 2.0000 | Freq: Every day | NASAL | 12 refills | Status: AC

## 2021-11-22 MED ORDER — OLOPATADINE HCL 0.1 % OP SOLN: 1.0000 [drp] | Freq: Two times a day (BID) | OPHTHALMIC | 0 refills | Status: AC

## 2021-11-22 NOTE — ED Provider Notes (Signed)
?[facility] EMERGENCY DEPARTMENT ?Provider Note ? ? ?CSN: [id number] ?Arrival date & time: 11/22/21  1438 ? ?  ? ?History ? ?Chief Complaint  ?Patient presents with  ? Nasal Congestion  ? ? ?Kaylee Kelly is a 36 y.o. female. ? ?The history is provided by the patient and medical records. No language interpreter was used.  ? ?36 year old female who presents with complaints of nasal congestion.  Patient states for the past 2 years she has been having seasonal allergies which includes itchy eyes, nasal congestion, sneezing, and throat irritation.  Symptom has become more uncontrollable within the past week.  She has tried numerous medication which includes Mucinex, pseudoephedrine, Flonase, Zyrtec with minimal relief.  She does not complain of any fever or rash. ? ?Home Medications ?Prior to Admission medications   ?Medication Sig Start Date End Date Taking? Authorizing Provider  ?acetaminophen (TYLENOL) 500 MG tablet Take 500 mg by mouth every 6 (six) hours as needed for moderate pain or headache.    [provider]  ?doxycycline (VIBRAMYCIN) 100 MG capsule Take 1 capsule (100 mg total) by mouth 2 (two) times daily. 01/03/21   Petrucelli, Samantha R, PA-C  ?guaiFENesin (MUCINEX) 600 MG 12 hr tablet Take 1 tablet (600 mg total) by mouth every 12 (twelve) hours as needed. 11/21/20   Couture, Cortni S, PA-C  ?metroNIDAZOLE (FLAGYL) 500 MG tablet Take 1 tablet (500 mg total) by mouth 2 (two) times daily. ?Patient not taking: Reported on 11/21/2020 10/30/20   Hermina Staggers, MD  ?ondansetron (ZOFRAN) 4 MG tablet Take 1 tablet (4 mg total) by mouth every 6 (six) hours. 11/21/20   Couture, Cortni S, PA-C  ?   ? ?Allergies    ?Patient has no known allergies.   ? ?Review of Systems   ?Review of Systems  ?All other systems reviewed and are negative. ? ?Physical Exam ?Updated Vital Signs ?BP 134/63 (BP Location: Right Arm)   Pulse 93   Temp 98.1 ?F (36.7 ?C) (Oral)   Resp 18   SpO2 98%  ?Physical  Exam ?Vitals and nursing note reviewed.  ?Constitutional:   ?   General: She is not in acute distress. ?   Appearance: She is well-developed.  ?HENT:  ?   Head: Atraumatic.  ?   Nose: Congestion and rhinorrhea present.  ?   Mouth/Throat:  ?   Mouth: Mucous membranes are moist.  ?Eyes:  ?   General:     ?   Right eye: Discharge present.     ?   Left eye: Discharge present. ?   Conjunctiva/sclera: Conjunctivae normal.  ?Cardiovascular:  ?   Rate and Rhythm: Normal rate and regular rhythm.  ?   Pulses: Normal pulses.  ?   Heart sounds: Normal heart sounds.  ?Pulmonary:  ?   Effort: Pulmonary effort is normal.  ?   Breath sounds: No wheezing, rhonchi or rales.  ?Musculoskeletal:  ?   Cervical back: Neck supple.  ?Skin: ?   Findings: No rash.  ?Neurological:  ?   Mental Status: She is alert.  ?Psychiatric:     ?   Mood and Affect: Mood normal.  ? ? ?ED Results / Procedures / Treatments   ?Labs ?(all labs ordered are listed, but only abnormal results are displayed) ?Labs Reviewed  ?RESP PANEL BY RT-PCR (FLU A&B, COVID) ARPGX2  ? ? ?EKG ?None ? ?Radiology ?No results found. ? ?Procedures ?Procedures  ? ? ?Medications Ordered in ED ?Medications - No  data to display ? ?ED Course/ Medical Decision Making/ A&P ?  ?                        ?Medical Decision Making ? ?BP 134/63 (BP Location: Right Arm)   Pulse 93   Temp 98.1 ?F (36.7 ?C) (Oral)   Resp 18   SpO2 98%  ? ?5:41 PM ?Patient presents with complaints of itchy eyes, nasal congestion, sneezing, and symptoms consistent with seasonal allergies.  She has tried numerous over-the-counter medication without relief.  Her symptom is not consistent with viral illness.  We will start patient on mometasone nasal spray and olopatadine antihistamine eye drops.  ? ? ? ? ? ? ? ?Final Clinical Impression(s) / ED Diagnoses ?Final diagnoses:  ?Seasonal allergies  ? ? ?Rx / DC Orders ?ED Discharge Orders   ? ?      Ordered  ?  mometasone (NASONEX) 50 MCG/ACT nasal spray  Daily       ?  11/22/21 1745  ?  olopatadine (PATANOL) 0.1 % ophthalmic solution  2 times daily       ? 11/22/21 1745  ? ?  ?  ? ?  ? ? ?  ?Fayrene Helper, PA-C ?11/22/21 1801 ? ?  ?Glynn Octave, MD ?11/22/21 1810 ? ?

## 2021-11-22 NOTE — ED Triage Notes (Signed)
Patient here with complaint of nasal congestion for the last several days. Patient states she has tried pseudoephedrine and mucinex with only mild temporary relief. Patient is alert, oriented, and in no apparent distress at this time. ?

## 2021-11-22 NOTE — Discharge Instructions (Signed)
I have prescribed 2 different medication for you to use to help you with your seasonal allergies.  You may also follow-up with allergist for further care. ?

## 2022-02-12 ENCOUNTER — Ambulatory Visit: Payer: BLUE CROSS/BLUE SHIELD | Admitting: Allergy

## 2022-07-05 ENCOUNTER — Emergency Department (HOSPITAL_COMMUNITY)
Admission: EM | Admit: 2022-07-05 | Discharge: 2022-07-06 | Disposition: A | Discharge status: Home / Self Care | Payer: BLUE CROSS/BLUE SHIELD | Attending: Emergency Medicine | Admitting: Emergency Medicine

## 2022-07-05 DIAGNOSIS — L03031 Cellulitis of right toe: Secondary | ICD-10-CM

## 2022-07-05 NOTE — ED Triage Notes (Signed)
Patient reports right great toe joint pain with swelling onset last week unrelieved by OTC pain medications , denies injury .

## 2022-07-06 LAB — CBG MONITORING, ED: Glucose-Capillary: 80 mg/dL (ref 70–99)

## 2022-07-06 MED ORDER — AMOXICILLIN-POT CLAVULANATE 875-125 MG PO TABS: 1.0000 | ORAL_TABLET | Freq: Two times a day (BID) | ORAL | 0 refills | Status: AC

## 2022-07-06 MED ORDER — ACETAMINOPHEN 500 MG PO TABS: 1000.0000 mg | ORAL_TABLET | Freq: Four times a day (QID) | ORAL | 0 refills | Status: AC | PRN

## 2022-07-06 MED ORDER — IBUPROFEN 400 MG PO TABS
600.0000 mg | ORAL_TABLET | Freq: Once | ORAL | Status: AC
  Administered 2022-07-06: 600 mg via ORAL
  Filled 2022-07-06: qty 1

## 2022-07-06 MED ORDER — TRIPLE ANTIBIOTIC 3.5-400-5000 EX OINT: 1.0000 | TOPICAL_OINTMENT | Freq: Three times a day (TID) | CUTANEOUS | 0 refills | Status: AC

## 2022-07-06 NOTE — ED Provider Notes (Signed)
[facility] EMERGENCY DEPARTMENT Provider Note   CSN: [id number] Arrival date & time: 07/05/22  2206     History  Chief Complaint  Patient presents with   Gout Flare Up    Right Great Toe    Kaylee Kelly is a 36 y.o. female.  HPI  Patient is a 36 year old female with history of anemia and headaches  She is present emergency room today with complaints of right great toe pain right around the nail plate.  Seems to be worse on the medial side of her right great toe nail. She denies any trauma or injury to her toe.  She has not any fevers no pain with wiggling her toe but persistent achy pain and discomfort with any touch.      Home Medications Prior to Admission medications   Medication Sig Start Date End Date Taking? Authorizing Provider  acetaminophen (TYLENOL) 500 MG tablet Take 2 tablets (1,000 mg total) by mouth every 6 (six) hours as needed. 07/06/22  Yes Junnie Loschiavo S, PA  amoxicillin-clavulanate (AUGMENTIN) 875-125 MG tablet Take 1 tablet by mouth every 12 (twelve) hours. 07/06/22  Yes Nolie Bignell, Stevphen Meuse S, PA  neomycin-bacitracin-polymyxin [phone] OINT Apply 1 Application topically 3 (three) times daily. 07/06/22  Yes Kateline Kinkade S, PA  guaiFENesin (MUCINEX) 600 MG 12 hr tablet Take 1 tablet (600 mg total) by mouth every 12 (twelve) hours as needed. 11/21/20   Couture, Cortni S, PA-C  metroNIDAZOLE (FLAGYL) 500 MG tablet Take 1 tablet (500 mg total) by mouth 2 (two) times daily. Patient not taking: Reported on 11/21/2020 10/30/20   Hermina Staggers, MD  mometasone (NASONEX) 50 MCG/ACT nasal spray Place 2 sprays into the nose daily. 11/22/21   Fayrene Helper, PA-C  olopatadine (PATANOL) 0.1 % ophthalmic solution Place 1 drop into both eyes 2 (two) times daily. 11/22/21   Fayrene Helper, PA-C  ondansetron (ZOFRAN) 4 MG tablet Take 1 tablet (4 mg total) by mouth every 6 (six) hours. 11/21/20   Couture, Cortni S, PA-C      Allergies    Patient has no known  allergies.    Review of Systems   Review of Systems  Physical Exam Updated Vital Signs BP 111/75   Pulse 65   Temp 97.6 F (36.4 C)   Resp 16   SpO2 100%  Physical Exam Vitals and nursing note reviewed.  Constitutional:      General: She is not in acute distress.    Appearance: Normal appearance. She is not ill-appearing.  HENT:     Head: Normocephalic and atraumatic.     Mouth/Throat:     Mouth: Mucous membranes are moist.  Eyes:     General: No scleral icterus.       Right eye: No discharge.        Left eye: No discharge.     Conjunctiva/sclera: Conjunctivae normal.  Pulmonary:     Effort: Pulmonary effort is normal.     Breath sounds: No stridor.  Skin:    General: Skin is warm and dry.     Comments: Slight erythema to the pinky toe side of the right great toe just pinky side of the nail.  See picture below.  There is no fluctuance.  Neurological:     Mental Status: She is alert and oriented to person, place, and time. Mental status is at baseline.     ED Results / Procedures / Treatments   Labs (all labs ordered are listed, but only  abnormal results are displayed) Labs Reviewed  CBG MONITORING, ED    EKG None  Radiology No results found.  Procedures Procedures    Medications Ordered in ED Medications  ibuprofen (ADVIL) tablet 600 mg (600 mg Oral Given 07/06/22 0013)    ED Course/ Medical Decision Making/ A&P                           Medical Decision Making Risk OTC drugs. Prescription drug management.   Patient is a 37 year old female with history of anemia and headaches  She is present emergency room today with complaints of right great toe pain right around the nail plate.  Seems to be worse on the medial side of her right great toe nail. She denies any trauma or injury to her toe.  She has not any fevers no pain with wiggling her toe but persistent achy pain and discomfort with any touch.  Physical exam significant for erythema I do  not appreciate a discrete abscess and so I think this represents either a very early paronychia or perhaps simply some inflammatory changes this is not consistent with gout or septic arthritis as there is no joint tenderness and she can wiggle her toe at each joint without difficulty.  Will treat with Augmentin for triple antibiotic ointment and Tylenol.  Recommend warm compresses and follow-up with PCP or return to the ER in 3 to 5 days for recheck.   Final Clinical Impression(s) / ED Diagnoses Final diagnoses:  Paronychia of great toe of right foot    Rx / DC Orders ED Discharge Orders          Ordered    neomycin-bacitracin-polymyxin [phone] OINT  3 times daily        07/06/22 0024    acetaminophen (TYLENOL) 500 MG tablet  Every 6 hours PRN        07/06/22 0024    amoxicillin-clavulanate (AUGMENTIN) 875-125 MG tablet  Every 12 hours        07/06/22 0024              Gailen Shelter, PA 07/06/22 0104    Melene Plan, DO 07/06/22 0154

## 2022-07-06 NOTE — Discharge Instructions (Addendum)
Please take antibiotics for the full course, you can apply the triple antibiotic ointment 3 times daily for 10 days.  Warm compresses 4 times daily.  Return to the emergency room for recheck if your symptoms or not improved in 4 to 5 days.  Please use Tylenol or ibuprofen for pain.  You may use 600 mg ibuprofen every 6 hours or 1000 mg of Tylenol every 6 hours.  You may choose to alternate between the 2.  This would be most effective.  Not to exceed 4 g of Tylenol within 24 hours.  Not to exceed 3200 mg ibuprofen 24 hours.

## 2022-07-06 NOTE — ED Provider Notes (Incomplete)
  [facility] EMERGENCY DEPARTMENT Provider Note   CSN: [id number] Arrival date & time: 07/05/22  2206     History {Add pertinent medical, surgical, social history, OB history to HPI:1} Chief Complaint  Patient presents with  . Gout Flare Up    Right Great Toe    Kaylee Kelly is a 36 y.o. female.  HPI     Home Medications Prior to Admission medications   Medication Sig Start Date End Date Taking? Authorizing Provider  acetaminophen (TYLENOL) 500 MG tablet Take 500 mg by mouth every 6 (six) hours as needed for moderate pain or headache.    [provider]  doxycycline (VIBRAMYCIN) 100 MG capsule Take 1 capsule (100 mg total) by mouth 2 (two) times daily. 01/03/21   Petrucelli, Pleas Koch, PA-C  guaiFENesin (MUCINEX) 600 MG 12 hr tablet Take 1 tablet (600 mg total) by mouth every 12 (twelve) hours as needed. 11/21/20   Couture, Cortni S, PA-C  metroNIDAZOLE (FLAGYL) 500 MG tablet Take 1 tablet (500 mg total) by mouth 2 (two) times daily. Patient not taking: Reported on 11/21/2020 10/30/20   Hermina Staggers, MD  mometasone (NASONEX) 50 MCG/ACT nasal spray Place 2 sprays into the nose daily. 11/22/21   Fayrene Helper, PA-C  olopatadine (PATANOL) 0.1 % ophthalmic solution Place 1 drop into both eyes 2 (two) times daily. 11/22/21   Fayrene Helper, PA-C  ondansetron (ZOFRAN) 4 MG tablet Take 1 tablet (4 mg total) by mouth every 6 (six) hours. 11/21/20   Couture, Cortni S, PA-C      Allergies    Patient has no known allergies.    Review of Systems   Review of Systems  Physical Exam Updated Vital Signs BP 111/75   Pulse 65   Temp 97.6 F (36.4 C)   Resp 16   SpO2 100%  Physical Exam  ED Results / Procedures / Treatments   Labs (all labs ordered are listed, but only abnormal results are displayed) Labs Reviewed  CBG MONITORING, ED    EKG None  Radiology No results found.  Procedures Procedures  {Document cardiac monitor, telemetry assessment procedure  when appropriate:1}  Medications Ordered in ED Medications - No data to display  ED Course/ Medical Decision Making/ A&P                           Medical Decision Making  ***  {Document critical care time when appropriate:1} {Document review of labs and clinical decision tools ie heart score, Chads2Vasc2 etc:1}  {Document your independent review of radiology images, and any outside records:1} {Document your discussion with family members, caretakers, and with consultants:1} {Document social determinants of health affecting pt's care:1} {Document your decision making why or why not admission, treatments were needed:1} Final Clinical Impression(s) / ED Diagnoses Final diagnoses:  None    Rx / DC Orders ED Discharge Orders     None

## 2022-09-26 ENCOUNTER — Other Ambulatory Visit: Payer: Self-pay

## 2022-09-26 ENCOUNTER — Emergency Department
Admission: EM | Admit: 2022-09-26 | Discharge: 2022-09-26 | Disposition: A | Discharge status: Home / Self Care | Payer: Self-pay | Attending: Emergency Medicine | Admitting: Emergency Medicine

## 2022-09-26 ENCOUNTER — Encounter: Payer: Self-pay | Admitting: Intensive Care

## 2022-09-26 DIAGNOSIS — T7840XA Allergy, unspecified, initial encounter: Secondary | ICD-10-CM | POA: Insufficient documentation

## 2022-09-26 DIAGNOSIS — L509 Urticaria, unspecified: Secondary | ICD-10-CM | POA: Insufficient documentation

## 2022-09-26 MED ORDER — FAMOTIDINE 20 MG PO TABS
20.0000 mg | ORAL_TABLET | Freq: Once | ORAL | Status: AC
  Administered 2022-09-26: 20 mg via ORAL
  Filled 2022-09-26: qty 1

## 2022-09-26 MED ORDER — PREDNISONE 20 MG PO TABS
50.0000 mg | ORAL_TABLET | Freq: Once | ORAL | Status: AC
  Administered 2022-09-26: 50 mg via ORAL
  Filled 2022-09-26: qty 3

## 2022-09-26 MED ORDER — DIPHENHYDRAMINE HCL 25 MG PO CAPS
25.0000 mg | ORAL_CAPSULE | Freq: Once | ORAL | Status: AC
  Administered 2022-09-26: 25 mg via ORAL
  Filled 2022-09-26: qty 1

## 2022-09-26 MED ORDER — PREDNISONE 50 MG PO TABS: ORAL_TABLET | ORAL | 0 refills | Status: AC

## 2022-09-26 NOTE — ED Provider Notes (Signed)
[facility] Provider Note    Event Date/Time   First MD Initiated Contact with Patient 09/26/22 [phone]     (approximate)   History   Allergic Reaction   HPI  Kaylee Kelly is a 37 y.o. female who presents today for evaluation of insect bites.  Patient reports that she woke up with a bite to her left cheek, her right arm, and her right leg.  She is unsure what bit her, but believes that she was bit by something.  She shares the bed with her husband, he does not have similar bites.  She denies trouble breathing or swallowing.  No tongue or lip swelling.  No allergies that she is aware of.  She has not taken anything for her symptoms.  There are no problems to display for this patient.         Physical Exam   Triage Vital Signs: ED Triage Vitals  Enc Vitals Group     BP 09/26/22 0728 99/70     Pulse Rate 09/26/22 0728 64     Resp 09/26/22 0728 16     Temp 09/26/22 0728 97.9 F (36.6 C)     Temp Source 09/26/22 0728 Oral     SpO2 09/26/22 0728 99 %     Weight 09/26/22 0731 120 lb (54.4 kg)     Height 09/26/22 0731 '5\' 4"'$  (1.626 m)     Head Circumference --      Peak Flow --      Pain Score 09/26/22 0731 8     Pain Loc --      Pain Edu? --      Excl. in [place]? --     Most recent vital signs: Vitals:   09/26/22 0728  BP: 99/70  Pulse: 64  Resp: 16  Temp: 97.9 F (36.6 C)  SpO2: 99%    Physical Exam Vitals and nursing note reviewed.  Constitutional:      General: Awake and alert. No acute distress.    Appearance: Normal appearance. The patient is normal weight.  HENT:     Head: Normocephalic and atraumatic.     Mouth: Mucous membranes are moist.  No tongue or lip swelling.  No oropharyngeal erythema or edema.  No voice change Eyes:     General: PERRL. Normal EOMs        Right eye: No discharge.        Left eye: No discharge.     Conjunctiva/sclera: Conjunctivae normal.  Cardiovascular:     Rate and Rhythm: Normal rate and regular  rhythm.     Pulses: Normal pulses.  Pulmonary:     Effort: Pulmonary effort is normal. No respiratory distress.     Breath sounds: Normal breath sounds.  Abdominal:     Abdomen is soft. There is no abdominal tenderness. No rebound or guarding. No distention. Musculoskeletal:        General: No swelling. Normal range of motion.     Cervical back: Normal range of motion and neck supple.  Skin:    General: Skin is warm and dry.     Capillary Refill: Capillary refill takes less than 2 seconds.     Findings: 3 isolated 2cm wheals noted, two to her upper thigh and 1 to her right wrist.  No appreciable swelling to face, though patient has mild acne.  No mucosal findings Neurological:     Mental Status: The patient is awake and alert.  ED Results / Procedures / Treatments   Labs (all labs ordered are listed, but only abnormal results are displayed) Labs Reviewed - No data to display   EKG     RADIOLOGY     PROCEDURES:  Critical Care performed:   Procedures   MEDICATIONS ORDERED IN ED: Medications  predniSONE (DELTASONE) tablet 50 mg (50 mg Oral Given 09/26/22 0754)  famotidine (PEPCID) tablet 20 mg (20 mg Oral Given 09/26/22 0754)  diphenhydrAMINE (BENADRYL) capsule 25 mg (25 mg Oral Given 09/26/22 0754)     IMPRESSION / MDM / ASSESSMENT AND PLAN / ED COURSE  I reviewed the triage vital signs and the nursing notes.   Differential diagnosis includes, but is not limited to, insect bite, allergic reaction, urticaria/wheals, contact dermatitis.  Patient is awake and alert, hemodynamically stable and afebrile.  She has no tongue or lip swelling to suggest angioedema.  No full body rash.  No dysphagia or wheezing.  No abdominal pain, nausea, vomiting, diarrhea.  Exam not consistent with angioedema or anaphylaxis.  There is no bullae, skin sloughing, or vesicular type lesions.  No mucosal findings.  Not consistent with SJS or TENS.  Patient was treated symptomatically with  prednisone, Pepcid, and Benadryl with resolution of her rash.  She reports that she feels significantly improved.  Patient was started on a short prednisone burst therapy.  She understands that she cannot drive, operate heavy machinery, or perform any test that require concentration while taking the Benadryl as this will make her drowsy.  Patient understands and agrees with plan.  She was discharged in stable condition with her husband.   Patient's presentation is most consistent with acute illness / injury with system symptoms.   Clinical Course as of 09/26/22 1120  Fri Sep 26, 2022  [id number] Patient reports rash and itchiness has resolved and she feels ready for discharge home.  Her husband is driving [JP]    Clinical Course User Index [JP] Kyannah Climer, Clarnce Flock, PA-C     FINAL CLINICAL IMPRESSION(S) / ED DIAGNOSES   Final diagnoses:  Allergic reaction, initial encounter  Urticaria     Rx / DC Orders   ED Discharge Orders          Ordered    predniSONE (DELTASONE) 50 MG tablet        09/26/22 0911             Note:  This document was prepared using Dragon voice recognition software and may include unintentional dictation errors.   Marquette Old, PA-C 09/26/22 1120    Nathaniel Man, MD 09/26/22 [phone]

## 2022-09-26 NOTE — Discharge Instructions (Signed)
Take the medication as prescribed.  Please return for any new, worsening, or change in symptoms or other concerns.  It was a pleasure caring for you today.

## 2022-09-26 NOTE — ED Notes (Signed)
See triage note  Presents with possible allergic rxn  States woke up with rash and eyes swollen  No resp distress noted

## 2022-09-26 NOTE — ED Triage Notes (Signed)
Patient presents with red rashes throughout body. Reports she went to bed fine and believes something bit her. Reports she itches all over. C/o swollen eyelids.   Denies trouble breathing.

## 2023-01-20 IMAGING — CR DG CHEST 2V
2 series · 2 of 2 positions shown · non-contrast
Comparison: Chest radiograph dated 11/21/2020.

CLINICAL DATA: 34-year-old female with shortness of breath.

EXAM:
CHEST - 2 VIEW

[chest pa]
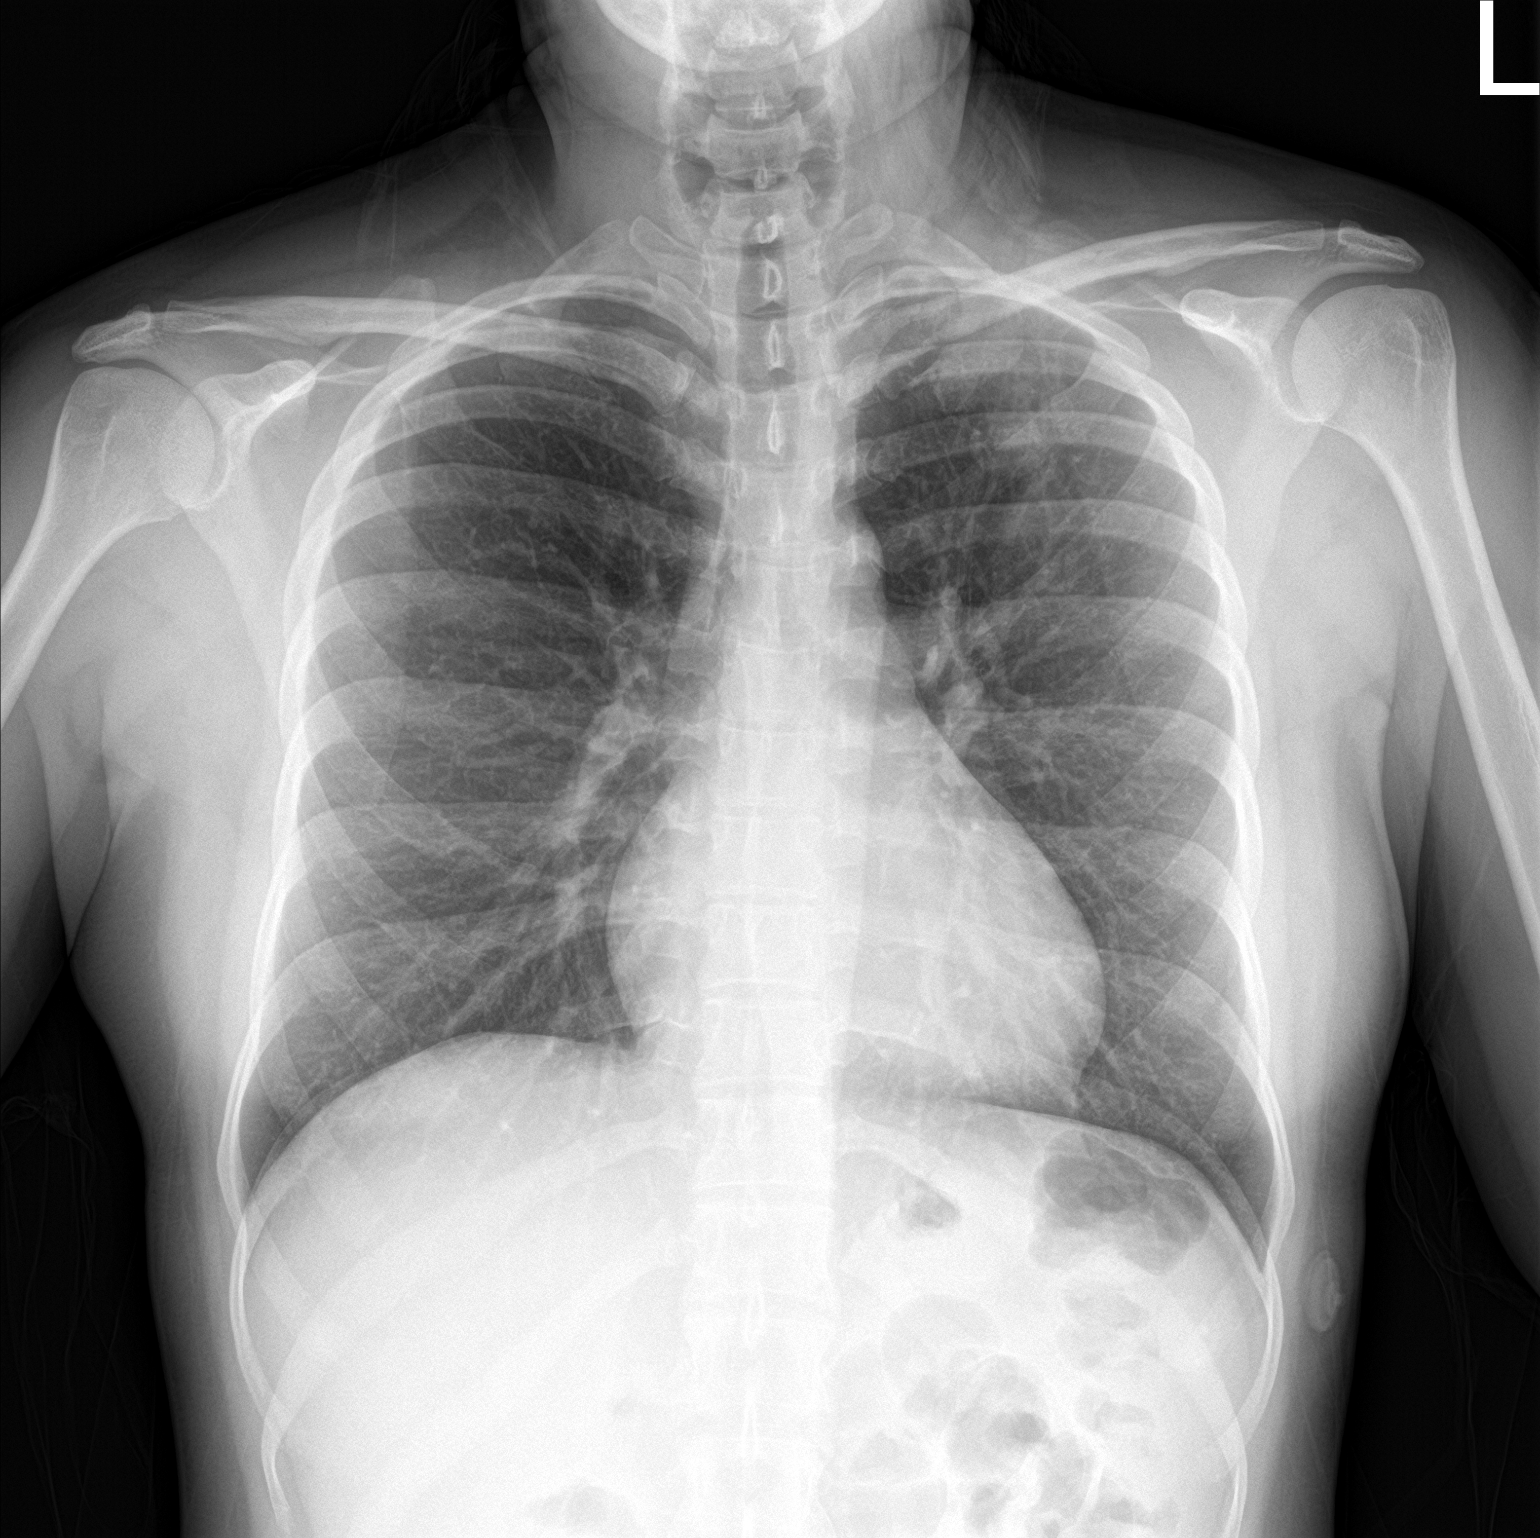

[chest lat]
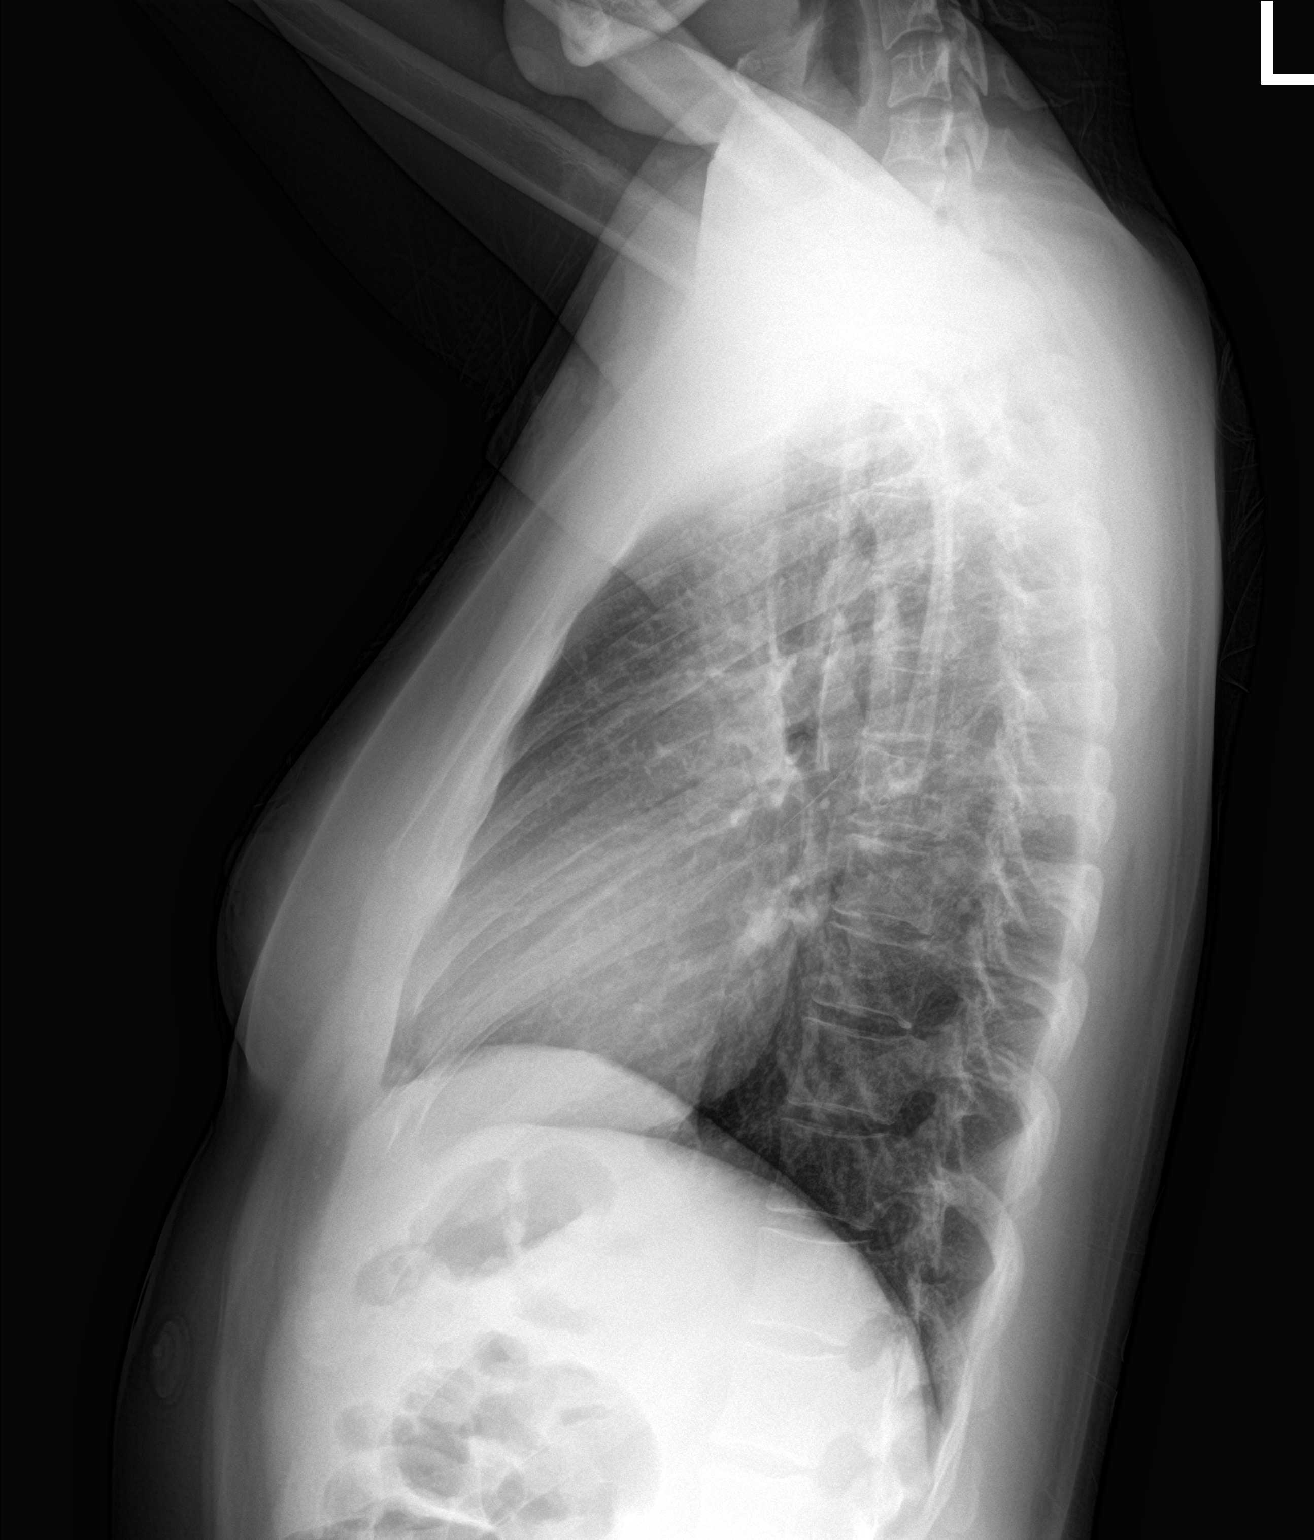

[2 of 2 positions shown; findings below may reference images not displayed]

FINDINGS: Faint density in the left apex may be artifactual and due to
superimposition of the patient's hair. Developing infiltrate is less
likely but not excluded clinical correlation is recommended. No
consolidative changes. There is no pleural effusion pneumothorax.
The cardiac silhouette is within limits. No acute osseous pathology.
IMPRESSION: Artifact versus less likely developing infiltrate in the left apex.

## 2023-05-09 ENCOUNTER — Emergency Department (HOSPITAL_COMMUNITY)
Admission: EM | Admit: 2023-05-09 | Discharge: 2023-05-10 | Discharge status: Against Medical Advice | Payer: No Typology Code available for payment source | Attending: Emergency Medicine | Admitting: Emergency Medicine

## 2023-05-09 ENCOUNTER — Other Ambulatory Visit: Payer: Self-pay

## 2023-05-09 ENCOUNTER — Emergency Department (HOSPITAL_COMMUNITY): Payer: No Typology Code available for payment source

## 2023-05-09 DIAGNOSIS — M542 Cervicalgia: Secondary | ICD-10-CM | POA: Diagnosis not present

## 2023-05-09 DIAGNOSIS — Y92410 Unspecified street and highway as the place of occurrence of the external cause: Secondary | ICD-10-CM | POA: Diagnosis not present

## 2023-05-09 DIAGNOSIS — Z5321 Procedure and treatment not carried out due to patient leaving prior to being seen by health care provider: Secondary | ICD-10-CM | POA: Insufficient documentation

## 2023-05-09 DIAGNOSIS — M25512 Pain in left shoulder: Secondary | ICD-10-CM | POA: Diagnosis present
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
## 2023-05-10 NOTE — ED Notes (Signed)
Pt left the lobby at 0430.

## 2023-08-09 ENCOUNTER — Other Ambulatory Visit: Payer: Self-pay

## 2023-08-09 ENCOUNTER — Emergency Department (HOSPITAL_COMMUNITY): Payer: Self-pay

## 2023-08-09 ENCOUNTER — Emergency Department (HOSPITAL_COMMUNITY)
Admission: EM | Admit: 2023-08-09 | Discharge: 2023-08-09 | Disposition: A | Discharge status: Home / Self Care | Payer: Self-pay | Attending: Emergency Medicine | Admitting: Emergency Medicine

## 2023-08-09 DIAGNOSIS — Z79899 Other long term (current) drug therapy: Secondary | ICD-10-CM | POA: Insufficient documentation

## 2023-08-09 DIAGNOSIS — J09X2 Influenza due to identified novel influenza A virus with other respiratory manifestations: Secondary | ICD-10-CM | POA: Insufficient documentation

## 2023-08-09 DIAGNOSIS — J101 Influenza due to other identified influenza virus with other respiratory manifestations: Secondary | ICD-10-CM

## 2023-08-09 DIAGNOSIS — Z20822 Contact with and (suspected) exposure to covid-19: Secondary | ICD-10-CM | POA: Insufficient documentation

## 2023-08-09 LAB — TROPONIN I (HIGH SENSITIVITY): Troponin I (High Sensitivity): <2 ng/L (ref <18)

## 2023-08-09 LAB — CBC WITH DIFFERENTIAL/PLATELET
Abs Immature Granulocytes: 0 10*3/uL (ref 0.00–0.07)
Basophils Absolute: 0 10*3/uL (ref 0.0–0.1)
Basophils Relative: 0 %
Eosinophils Absolute: 0.2 10*3/uL (ref 0.0–0.5)
Eosinophils Relative: 4 %
HCT: 39.1 % (ref 36.0–46.0)
Hemoglobin: 13.1 g/dL (ref 12.0–15.0)
Immature Granulocytes: 0 %
Lymphocytes Relative: 30 %
Lymphs Abs: 1.4 10*3/uL (ref 0.7–4.0)
MCH: 30.5 pg (ref 26.0–34.0)
MCHC: 33.5 g/dL (ref 30.0–36.0)
MCV: 90.9 fL (ref 80.0–100.0)
Monocytes Absolute: 0.5 10*3/uL (ref 0.1–1.0)
Monocytes Relative: 9 %
Neutro Abs: 2.7 10*3/uL (ref 1.7–7.7)
Neutrophils Relative %: 57 %
Platelets: 220 10*3/uL (ref 150–400)
RBC: 4.3 MIL/uL (ref 3.87–5.11)
RDW: 12.6 % (ref 11.5–15.5)
WBC: 4.8 10*3/uL (ref 4.0–10.5)
nRBC: 0 % (ref 0.0–0.2)

## 2023-08-09 LAB — RESP PANEL BY RT-PCR (RSV, FLU A&B, COVID)  RVPGX2
Influenza A by PCR: POSITIVE — AB
Influenza B by PCR: NEGATIVE
Resp Syncytial Virus by PCR: NEGATIVE
SARS Coronavirus 2 by RT PCR: NEGATIVE

## 2023-08-09 LAB — BASIC METABOLIC PANEL
Anion gap: 11 (ref 5–15)
BUN: <5 mg/dL — ABNORMAL LOW (ref 6–20)
CO2: 23 mmol/L (ref 22–32)
Calcium: 8.6 mg/dL — ABNORMAL LOW (ref 8.9–10.3)
Chloride: 106 mmol/L (ref 98–111)
Creatinine, Ser: 0.53 mg/dL (ref 0.44–1.00)
GFR, Estimated: >60 mL/min (ref >60)
Glucose, Bld: 98 mg/dL (ref 70–99)
Potassium: 3.8 mmol/L (ref 3.5–5.1)
Sodium: 140 mmol/L (ref 135–145)

## 2023-08-09 LAB — GROUP A STREP BY PCR: Group A Strep by PCR: NOT DETECTED

## 2023-08-09 MED ORDER — BENZONATATE 100 MG PO CAPS: 100.0000 mg | ORAL_CAPSULE | Freq: Three times a day (TID) | ORAL | 0 refills | Status: DC

## 2023-08-09 MED ORDER — ACETAMINOPHEN 500 MG PO TABS: 1000.0000 mg | ORAL_TABLET | Freq: Once | ORAL | Status: DC

## 2023-08-09 MED ORDER — ONDANSETRON 4 MG PO TBDP: ORAL_TABLET | ORAL | 0 refills | Status: AC

## 2023-08-09 NOTE — ED Provider Triage Note (Signed)
Emergency Medicine Provider Triage Evaluation Note  Kaylee Kelly , a 38 y.o. female  was evaluated in triage.  Pt complains of multiple complaints.  Sick for month - diarrhea (3x a day) Started vomiting last week whenever drinking or eating Nonproductive cough, myalgia, and general weakness for 2 weeks.  Sore throat started yesterday Has tried tea, tylenol, motrin  Sought evaluation in ED due to SOB and CP starting yesterday 10/10 constant CP described as stabbing that radiates to back  Review of Systems  Positive: N/V/D, CP, myalgia, decreased appetite Negative:   Physical Exam  BP 114/72 (BP Location: Right Arm)   Pulse 95   Temp 99.2 F (37.3 C)   Resp 16   Ht 5\' 4"  (1.626 m)   Wt 59 kg   SpO2 99%   BMI 22.31 kg/m  Gen:   Awake, no distress   Resp:  Normal effort  MSK:   Moves extremities without difficulty  Other:    Medical Decision Making  Medically screening exam initiated at 1:40 PM.  Appropriate orders placed.  Karisa Hackbarth was informed that the remainder of the evaluation will be completed by another provider, this initial triage assessment does not replace that evaluation, and the importance of remaining in the ED until their evaluation is complete.  Labs ordered   Judithann Sheen, [place] 08/09/23 1346

## 2023-08-09 NOTE — Discharge Instructions (Signed)
Take tylenol 2 pills 4 times a day and motrin 4 pills 3 times a day.  Drink plenty of fluids.  Return for worsening shortness of breath, headache, confusion. Follow up with your family doctor.   

## 2023-08-09 NOTE — ED Triage Notes (Addendum)
Pt presents ShOB, productive cough, subjective fevers, body aches and fatigue x 1. Pt reports sick contacts.

## 2023-08-09 NOTE — ED Provider Notes (Signed)
[place] EMERGENCY DEPARTMENT AT [facility] Provider Note   CSN: [id number] Arrival date & time: 08/09/23  1319     History  Chief Complaint  Patient presents with   Shortness of Breath         Kaylee Kelly is a 38 y.o. female.  38 yo F with a chief complaints of cough congestion fevers chills myalgias going on for the better part of the month.  She said about a week ago she recently felt a bit worse.  Having pain in her abdomen and her back.  Having trouble eating and drinking for the past week.   Shortness of Breath      Home Medications Prior to Admission medications   Medication Sig Start Date End Date Taking? Authorizing Provider  benzonatate (TESSALON) 100 MG capsule Take 1 capsule (100 mg total) by mouth every 8 (eight) hours. 08/09/23  Yes Melene Plan, DO  ondansetron (ZOFRAN-ODT) 4 MG disintegrating tablet 4mg  ODT q4 hours prn nausea/vomit 08/09/23  Yes Melene Plan, DO  acetaminophen (TYLENOL) 500 MG tablet Take 2 tablets (1,000 mg total) by mouth every 6 (six) hours as needed. 07/06/22   Gailen Shelter, PA  amoxicillin-clavulanate (AUGMENTIN) 875-125 MG tablet Take 1 tablet by mouth every 12 (twelve) hours. 07/06/22   Gailen Shelter, PA  guaiFENesin (MUCINEX) 600 MG 12 hr tablet Take 1 tablet (600 mg total) by mouth every 12 (twelve) hours as needed. 11/21/20   Couture, Cortni S, PA-C  metroNIDAZOLE (FLAGYL) 500 MG tablet Take 1 tablet (500 mg total) by mouth 2 (two) times daily. Patient not taking: Reported on 11/21/2020 10/30/20   Hermina Staggers, MD  mometasone (NASONEX) 50 MCG/ACT nasal spray Place 2 sprays into the nose daily. 11/22/21   Fayrene Helper, PA-C  neomycin-bacitracin-polymyxin [phone] OINT Apply 1 Application topically 3 (three) times daily. 07/06/22   Gailen Shelter, PA  olopatadine (PATANOL) 0.1 % ophthalmic solution Place 1 drop into both eyes 2 (two) times daily. 11/22/21   Fayrene Helper, PA-C  ondansetron (ZOFRAN) 4 MG tablet Take 1  tablet (4 mg total) by mouth every 6 (six) hours. 11/21/20   Couture, Cortni S, PA-C  predniSONE (DELTASONE) 50 MG tablet Take one tablet by mouth daily starting tomorrow 09/27/22 for 4 days 09/26/22   Poggi, Herb Grays, PA-C      Allergies    Patient has no known allergies.    Review of Systems   Review of Systems  Respiratory:  Positive for shortness of breath.     Physical Exam Updated Vital Signs BP 114/72 (BP Location: Right Arm)   Pulse 95   Temp 99.2 F (37.3 C)   Resp 16   Ht 5\' 4"  (1.626 m)   Wt 59 kg   SpO2 99%   BMI 22.31 kg/m  Physical Exam Vitals and nursing note reviewed.  Constitutional:      General: She is not in acute distress.    Appearance: She is well-developed. She is not diaphoretic.  HENT:     Head: Normocephalic and atraumatic.     Comments: Swollen turbinates  Eyes:     Pupils: Pupils are equal, round, and reactive to light.  Cardiovascular:     Rate and Rhythm: Normal rate and regular rhythm.     Heart sounds: No murmur heard.    No friction rub. No gallop.  Pulmonary:     Effort: Pulmonary effort is normal.     Breath sounds: No wheezing or  rales.  Abdominal:     General: There is no distension.     Palpations: Abdomen is soft.     Tenderness: There is no abdominal tenderness.  Musculoskeletal:        General: No tenderness.     Cervical back: Normal range of motion and neck supple.  Skin:    General: Skin is warm and dry.  Neurological:     Mental Status: She is alert and oriented to person, place, and time.  Psychiatric:        Behavior: Behavior normal.     ED Results / Procedures / Treatments   Labs (all labs ordered are listed, but only abnormal results are displayed) Labs Reviewed  RESP PANEL BY RT-PCR (RSV, FLU A&B, COVID)  RVPGX2 - Abnormal; Notable for the following components:      Result Value   Influenza A by PCR POSITIVE (*)    All other components within normal limits  BASIC METABOLIC PANEL - Abnormal; Notable for the  following components:   BUN <5 (*)    Calcium 8.6 (*)    All other components within normal limits  GROUP A STREP BY PCR  CBC WITH DIFFERENTIAL/PLATELET  TROPONIN I (HIGH SENSITIVITY)  TROPONIN I (HIGH SENSITIVITY)    EKG EKG Interpretation Date/Time:  Sunday August 09 2023 13:33:21 EST Ventricular Rate:  95 PR Interval:  118 QRS Duration:  70 QT Interval:  348 QTC Calculation: 437 R Axis:   71  Text Interpretation: Normal sinus rhythm Normal ECG No significant change since last tracing Confirmed by Melene Plan [phone]) on 08/09/2023 4:40:37 PM  Radiology DG Chest 2 View Result Date: 08/09/2023 CLINICAL DATA:  Shortness of breath EXAM: CHEST - 2 VIEW COMPARISON:  01/02/2021 FINDINGS: The heart size and mediastinal contours are within normal limits. Both lungs are clear. The visualized skeletal structures are unremarkable. IMPRESSION: No active cardiopulmonary disease. Electronically Signed   By: Duanne Guess D.O.   On: 08/09/2023 14:29    Procedures Procedures    Medications Ordered in ED Medications  acetaminophen (TYLENOL) tablet 1,000 mg (1,000 mg Oral Not Given 08/09/23 1641)    ED Course/ Medical Decision Making/ A&P                                 Medical Decision Making Amount and/or Complexity of Data Reviewed Radiology: ordered.  Risk Prescription drug management.   38 yo F with a chief complaints of cough congestion fevers chills myalgias nausea vomiting going on for a couple weeks now which she thinks worsening over the past week.  She does not feel like she is been able to eat and drink well over the past week though she does not clinically appear dehydrated.  Has moist mucous membranes good tear film.  Good skin turgor.  She is having no difficulty breathing on exam.  Clear lung sounds.  Lab work without any change to baseline renal function, no leukocytosis, no anemia.  Chest x-ray independently interpreted by me without focal infiltrate or  pneumothorax.  Her viral panel is positive for the flu.  Will have her follow-up with her family doctor in the office. 4:47 PM:  I have discussed the diagnosis/risks/treatment options with the patient.  Evaluation and diagnostic testing in the emergency department does not suggest an emergent condition requiring admission or immediate intervention beyond what has been performed at this time.  They will follow up with  PCP. We also discussed returning to the ED immediately if new or worsening sx occur. We discussed the sx which are most concerning (e.g., sudden worsening pain, fever, inability to tolerate by mouth) that necessitate immediate return. Medications administered to the patient during their visit and any new prescriptions provided to the patient are listed below.  Medications given during this visit Medications  acetaminophen (TYLENOL) tablet 1,000 mg (1,000 mg Oral Not Given 08/09/23 1641)     The patient appears reasonably screen and/or stabilized for discharge and I doubt any other medical condition or other [facility] requiring further screening, evaluation, or treatment in the ED at this time prior to discharge.           Final Clinical Impression(s) / ED Diagnoses Final diagnoses:  Influenza A    Rx / DC Orders ED Discharge Orders          Ordered    ondansetron (ZOFRAN-ODT) 4 MG disintegrating tablet        08/09/23 1645    benzonatate (TESSALON) 100 MG capsule  Every 8 hours        08/09/23 1645              [place], DO 08/09/23 1647

## 2024-03-25 ENCOUNTER — Emergency Department (HOSPITAL_COMMUNITY)

## 2024-03-25 ENCOUNTER — Encounter (HOSPITAL_COMMUNITY): Payer: Self-pay

## 2024-03-25 ENCOUNTER — Emergency Department (HOSPITAL_COMMUNITY)
Admission: EM | Admit: 2024-03-25 | Discharge: 2024-03-26 | Discharge status: Against Medical Advice | Attending: Emergency Medicine | Admitting: Emergency Medicine

## 2024-03-25 ENCOUNTER — Other Ambulatory Visit: Payer: Self-pay

## 2024-03-25 DIAGNOSIS — Z5321 Procedure and treatment not carried out due to patient leaving prior to being seen by health care provider: Secondary | ICD-10-CM | POA: Diagnosis not present

## 2024-03-25 DIAGNOSIS — M25512 Pain in left shoulder: Secondary | ICD-10-CM | POA: Insufficient documentation

## 2024-03-25 DIAGNOSIS — M25551 Pain in right hip: Secondary | ICD-10-CM | POA: Diagnosis not present

## 2024-03-25 DIAGNOSIS — M542 Cervicalgia: Secondary | ICD-10-CM | POA: Diagnosis present

## 2024-03-25 DIAGNOSIS — Y92410 Unspecified street and highway as the place of occurrence of the external cause: Secondary | ICD-10-CM | POA: Diagnosis not present

## 2024-03-25 LAB — HCG, SERUM, QUALITATIVE: Preg, Serum: NEGATIVE
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
## 2024-03-26 NOTE — ED Notes (Signed)
 Patient did not want to stay, stated she will be back in the AM. Taking OTF

## 2024-05-28 ENCOUNTER — Emergency Department (HOSPITAL_COMMUNITY): Payer: Self-pay

## 2024-05-28 ENCOUNTER — Emergency Department (HOSPITAL_COMMUNITY)
Admission: EM | Admit: 2024-05-28 | Discharge: 2024-05-28 | Disposition: A | Discharge status: Home / Self Care | Payer: Self-pay | Attending: Emergency Medicine | Admitting: Emergency Medicine

## 2024-05-28 ENCOUNTER — Encounter (HOSPITAL_COMMUNITY): Payer: Self-pay

## 2024-05-28 ENCOUNTER — Other Ambulatory Visit: Payer: Self-pay

## 2024-05-28 DIAGNOSIS — R067 Sneezing: Secondary | ICD-10-CM | POA: Insufficient documentation

## 2024-05-28 DIAGNOSIS — R5383 Other fatigue: Secondary | ICD-10-CM | POA: Insufficient documentation

## 2024-05-28 DIAGNOSIS — R42 Dizziness and giddiness: Secondary | ICD-10-CM | POA: Insufficient documentation

## 2024-05-28 DIAGNOSIS — R109 Unspecified abdominal pain: Secondary | ICD-10-CM | POA: Insufficient documentation

## 2024-05-28 DIAGNOSIS — R059 Cough, unspecified: Secondary | ICD-10-CM | POA: Insufficient documentation

## 2024-05-28 LAB — URINALYSIS, ROUTINE W REFLEX MICROSCOPIC
Bilirubin Urine: NEGATIVE
Glucose, UA: NEGATIVE mg/dL
Hgb urine dipstick: NEGATIVE
Ketones, ur: NEGATIVE mg/dL
Nitrite: NEGATIVE
Protein, ur: NEGATIVE mg/dL
Specific Gravity, Urine: 1.021 (ref 1.005–1.030)
pH: 5 (ref 5.0–8.0)

## 2024-05-28 LAB — COMPREHENSIVE METABOLIC PANEL WITH GFR
ALT: 17 U/L (ref 0–44)
AST: 24 U/L (ref 15–41)
Albumin: 4.5 g/dL (ref 3.5–5.0)
Alkaline Phosphatase: 115 U/L (ref 38–126)
Anion gap: 11 (ref 5–15)
BUN: 6 mg/dL (ref 6–20)
CO2: 25 mmol/L (ref 22–32)
Calcium: 8.9 mg/dL (ref 8.9–10.3)
Chloride: 103 mmol/L (ref 98–111)
Creatinine, Ser: 0.5 mg/dL (ref 0.44–1.00)
GFR, Estimated: >60 mL/min (ref >60)
Glucose, Bld: 101 mg/dL — ABNORMAL HIGH (ref 70–99)
Potassium: 3.7 mmol/L (ref 3.5–5.1)
Sodium: 139 mmol/L (ref 135–145)
Total Bilirubin: 0.9 mg/dL (ref 0.0–1.2)
Total Protein: 8 g/dL (ref 6.5–8.1)

## 2024-05-28 LAB — CBC
HCT: 41.6 % (ref 36.0–46.0)
Hemoglobin: 13.4 g/dL (ref 12.0–15.0)
MCH: 29.1 pg (ref 26.0–34.0)
MCHC: 32.2 g/dL (ref 30.0–36.0)
MCV: 90.4 fL (ref 80.0–100.0)
Platelets: 263 K/uL (ref 150–400)
RBC: 4.6 MIL/uL (ref 3.87–5.11)
RDW: 12.6 % (ref 11.5–15.5)
WBC: 10.5 K/uL (ref 4.0–10.5)
nRBC: 0 % (ref 0.0–0.2)

## 2024-05-28 LAB — HCG, SERUM, QUALITATIVE: Preg, Serum: NEGATIVE

## 2024-05-28 LAB — RESP PANEL BY RT-PCR (RSV, FLU A&B, COVID)  RVPGX2
Influenza A by PCR: NEGATIVE
Influenza B by PCR: NEGATIVE
Resp Syncytial Virus by PCR: NEGATIVE
SARS Coronavirus 2 by RT PCR: NEGATIVE

## 2024-05-28 LAB — LIPASE, BLOOD: Lipase: 31 U/L (ref 11–51)

## 2024-05-28 MED ORDER — BENZONATATE 100 MG PO CAPS: 100.0000 mg | ORAL_CAPSULE | Freq: Three times a day (TID) | ORAL | 0 refills | Status: AC

## 2024-05-28 NOTE — ED Triage Notes (Signed)
 Reports cough for 3 weeks. This week she reports sneezing and some abdominal pain. States she felt weak at work as if she may faint. Denies nausea or vomiting. Denies Constipation or diarrhea. States she get full with small portions of food and she can go the whole day without eating.

## 2024-05-28 NOTE — Discharge Instructions (Addendum)
 Your workup this evening was reassuring.  Please take the prescribed cough medication.  Return to the emergency department if you develop any life-threatening symptoms.

## 2024-05-28 NOTE — ED Provider Notes (Signed)
[place] EMERGENCY DEPARTMENT AT [facility] Provider Note   CSN: [id number] Arrival date & time: 05/28/24  0050     Patient presents with: Cough   Kaylee Kelly is a 38 y.o. female.  Patient presents to the emergency department complaining of 3 weeks of cough which is nonproductive in nature.  She does endorse occasional sneezing and mild abdominal pain that began this week.  She also states that she felt slightly tired at work and lightheaded.  She denies nausea, vomiting, chest pain, shortness of breath.  She denies bowel changes.  She endorses occasionally feeling full with early satiety.  Past medical history significant for anemia    Cough      Prior to Admission medications   Medication Sig Start Date End Date Taking? Authorizing Provider  benzonatate  (TESSALON ) 100 MG capsule Take 1 capsule (100 mg total) by mouth every 8 (eight) hours. 05/28/24  Yes Logan Ubaldo NOVAK, PA-C  acetaminophen  (TYLENOL ) 500 MG tablet Take 2 tablets (1,000 mg total) by mouth every 6 (six) hours as needed. 07/06/22   Neldon Hamp RAMAN, PA  amoxicillin -clavulanate (AUGMENTIN ) 875-125 MG tablet Take 1 tablet by mouth every 12 (twelve) hours. 07/06/22   [place], Hamp RAMAN, PA  guaiFENesin  (MUCINEX ) 600 MG 12 hr tablet Take 1 tablet (600 mg total) by mouth every 12 (twelve) hours as needed. 11/21/20   Couture, Cortni S, PA-C  metroNIDAZOLE  (FLAGYL ) 500 MG tablet Take 1 tablet (500 mg total) by mouth 2 (two) times daily. Patient not taking: Reported on 11/21/2020 10/30/20   Ervin, Michael L, MD  mometasone  (NASONEX ) 50 MCG/ACT nasal spray Place 2 sprays into the nose daily. 11/22/21   Nivia Colon, PA-C  neomycin-bacitracin-polymyxin [phone] OINT Apply 1 Application topically 3 (three) times daily. 07/06/22   Neldon Hamp RAMAN, PA  olopatadine  (PATANOL) 0.1 % ophthalmic solution Place 1 drop into both eyes 2 (two) times daily. 11/22/21   Nivia Colon, PA-C  ondansetron  (ZOFRAN ) 4 MG tablet Take 1 tablet  (4 mg total) by mouth every 6 (six) hours. 11/21/20   Couture, Cortni S, PA-C  ondansetron  (ZOFRAN -ODT) 4 MG disintegrating tablet 4mg  ODT q4 hours prn nausea/vomit 08/09/23   Floyd, Dan, DO  predniSONE  (DELTASONE ) 50 MG tablet Take one tablet by mouth daily starting tomorrow 09/27/22 for 4 days 09/26/22   Poggi, Jenna E, PA-C    Allergies: Patient has no known allergies.    Review of Systems  Respiratory:  Positive for cough.     Updated Vital Signs BP 120/84   Pulse 80   Temp 98.2 F (36.8 C) (Oral)   Resp 18   Ht 5' 4 (1.626 m)   SpO2 100%   BMI 23.17 kg/m   Physical Exam Vitals and nursing note reviewed.  Constitutional:      General: She is not in acute distress.    Appearance: She is well-developed.  HENT:     Head: Normocephalic and atraumatic.  Eyes:     Conjunctiva/sclera: Conjunctivae normal.  Cardiovascular:     Rate and Rhythm: Normal rate and regular rhythm.  Pulmonary:     Effort: Pulmonary effort is normal. No respiratory distress.     Breath sounds: Normal breath sounds.  Abdominal:     Palpations: Abdomen is soft.     Tenderness: There is no abdominal tenderness.  Musculoskeletal:        General: No swelling.     Cervical back: Neck supple.  Skin:    General: Skin is  warm and dry.     Capillary Refill: Capillary refill takes less than 2 seconds.  Neurological:     Mental Status: She is alert.  Psychiatric:        Mood and Affect: Mood normal.     (all labs ordered are listed, but only abnormal results are displayed) Labs Reviewed  COMPREHENSIVE METABOLIC PANEL WITH GFR - Abnormal; Notable for the following components:      Result Value   Glucose, Bld 101 (*)    All other components within normal limits  URINALYSIS, ROUTINE W REFLEX MICROSCOPIC - Abnormal; Notable for the following components:   APPearance HAZY (*)    Leukocytes,Ua MODERATE (*)    Bacteria, UA RARE (*)    All other components within normal limits  RESP PANEL BY RT-PCR (RSV, FLU  A&B, COVID)  RVPGX2  LIPASE, BLOOD  CBC  HCG, SERUM, QUALITATIVE    EKG: None  Radiology: DG Chest 2 View Result Date: 05/28/2024 EXAM: 2 VIEW(S) XRAY OF THE CHEST 05/28/2024 02:12:00 AM COMPARISON: 08/09/2023 CLINICAL HISTORY: DYspnea FINDINGS: LUNGS AND PLEURA: No focal pulmonary opacity. No pleural effusion. No pneumothorax. HEART AND MEDIASTINUM: No acute abnormality of the cardiac and mediastinal silhouettes. BONES AND SOFT TISSUES: No acute osseous abnormality. IMPRESSION: 1. No acute cardiopulmonary process. Electronically signed by: Oneil Devonshire MD 05/28/2024 02:20 AM EST RP Workstation: [id number]     Procedures   Medications Ordered in the ED - No data to display                                  Medical Decision Making Amount and/or Complexity of Data Reviewed Labs: ordered. Radiology: ordered.  Risk Prescription drug management.   This patient presents to the ED for concern of cough, this involves an extensive number of treatment options, and is a complaint that carries with it a high risk of complications and morbidity.  The differential diagnosis includes pneumonia, postviral cough, bronchitis, viral respiratory illness, others   Co morbidities / Chronic conditions that complicate the patient evaluation  Anemia   Additional history obtained:  Additional history obtained from EMR   Lab Tests:  I Ordered, and personally interpreted labs.  The pertinent results include: Unremarkable CMP, CBC, lipase.  Negative pregnancy test.  Unremarkable respiratory panel.  UA with rare bacteria and moderate leukocytes.    Imaging Studies ordered:  I ordered imaging studies including chest x-ray I independently visualized and interpreted imaging which showed no acute findings I agree with the radiologist interpretation   Social Determinants of Health:  Patient is self-pay   Test / Admission - Considered:  Patient with reassuring workup.  Clear chest x-ray.  No  abdominal tenderness on examination.  Patient tolerating oral intake without difficulty.  Feel that patient's cough is likely a postviral cough.  Will prescribe Tessalon  Perles.  I have recommended that patient follow-up with a primary care provider for further evaluation as needed and for routine medical care.  Return precautions have been provided.      Final diagnoses:  Cough, unspecified type    ED Discharge Orders          Ordered    benzonatate  (TESSALON ) 100 MG capsule  Every 8 hours        05/28/24 0355               Logan Ubaldo NOVAK, PA-C 05/28/24 0404    Midge Golas, MD  05/28/24 0438  

## 2024-08-22 ENCOUNTER — Emergency Department (HOSPITAL_COMMUNITY)
Admission: EM | Admit: 2024-08-22 | Discharge: 2024-08-23 | Disposition: A | Payer: Self-pay | Source: Home / Self Care | Attending: Emergency Medicine | Admitting: Emergency Medicine

## 2024-08-22 ENCOUNTER — Other Ambulatory Visit: Payer: Self-pay

## 2024-08-22 ENCOUNTER — Encounter (HOSPITAL_COMMUNITY): Payer: Self-pay

## 2024-08-22 DIAGNOSIS — B9689 Other specified bacterial agents as the cause of diseases classified elsewhere: Secondary | ICD-10-CM

## 2024-08-22 DIAGNOSIS — R102 Pelvic and perineal pain unspecified side: Secondary | ICD-10-CM

## 2024-08-22 DIAGNOSIS — R140 Abdominal distension (gaseous): Secondary | ICD-10-CM

## 2024-08-22 LAB — COMPREHENSIVE METABOLIC PANEL WITH GFR
ALT: 21 U/L (ref 0–44)
AST: 31 U/L (ref 15–41)
Albumin: 4.1 g/dL (ref 3.5–5.0)
Alkaline Phosphatase: 112 U/L (ref 38–126)
Anion gap: 12 (ref 5–15)
BUN: 7 mg/dL (ref 6–20)
CO2: 24 mmol/L (ref 22–32)
Calcium: 8.9 mg/dL (ref 8.9–10.3)
Chloride: 106 mmol/L (ref 98–111)
Creatinine, Ser: 0.5 mg/dL (ref 0.44–1.00)
GFR, Estimated: >60 mL/min
Glucose, Bld: 138 mg/dL — ABNORMAL HIGH (ref 70–99)
Potassium: 3.4 mmol/L — ABNORMAL LOW (ref 3.5–5.1)
Sodium: 141 mmol/L (ref 135–145)
Total Bilirubin: 1 mg/dL (ref 0.0–1.2)
Total Protein: 7.6 g/dL (ref 6.5–8.1)

## 2024-08-22 LAB — CBC
HCT: 40.2 % (ref 36.0–46.0)
Hemoglobin: 13.3 g/dL (ref 12.0–15.0)
MCH: 30 pg (ref 26.0–34.0)
MCHC: 33.1 g/dL (ref 30.0–36.0)
MCV: 90.5 fL (ref 80.0–100.0)
Platelets: 305 10*3/uL (ref 150–400)
RBC: 4.44 MIL/uL (ref 3.87–5.11)
RDW: 12.6 % (ref 11.5–15.5)
WBC: 9.3 10*3/uL (ref 4.0–10.5)
nRBC: 0 % (ref 0.0–0.2)

## 2024-08-22 LAB — LIPASE, BLOOD: Lipase: 28 U/L (ref 11–51)

## 2024-08-22 LAB — URINALYSIS, ROUTINE W REFLEX MICROSCOPIC
Bilirubin Urine: NEGATIVE
Glucose, UA: NEGATIVE mg/dL
Hgb urine dipstick: NEGATIVE
Ketones, ur: NEGATIVE mg/dL
Nitrite: NEGATIVE
Protein, ur: NEGATIVE mg/dL
Specific Gravity, Urine: 1.02 (ref 1.005–1.030)
pH: 6.5 (ref 5.0–8.0)

## 2024-08-22 LAB — URINALYSIS, MICROSCOPIC (REFLEX)

## 2024-08-22 LAB — HCG, SERUM, QUALITATIVE: Preg, Serum: NEGATIVE

## 2024-08-22 NOTE — ED Triage Notes (Signed)
 Complaining of lower abdominal pain that started 3 months ago. Said that she has a vaginal odor, denies any urinary problems. She said that her abdomen is swollen and it hurts when she has sexual intercourse.

## 2024-08-23 ENCOUNTER — Emergency Department (HOSPITAL_COMMUNITY): Payer: Self-pay

## 2024-08-23 LAB — GC/CHLAMYDIA PROBE AMP (~~LOC~~) NOT AT ARMC
Chlamydia: NEGATIVE
Comment: NEGATIVE
Comment: NORMAL
Neisseria Gonorrhea: NEGATIVE

## 2024-08-23 LAB — WET PREP, GENITAL
Sperm: NONE SEEN
Trich, Wet Prep: NONE SEEN
WBC, Wet Prep HPF POC: >=10 — AB
Yeast Wet Prep HPF POC: NONE SEEN

## 2024-08-23 MED ORDER — IBUPROFEN 600 MG PO TABS: 600.0000 mg | ORAL_TABLET | Freq: Four times a day (QID) | ORAL | 0 refills | Status: AC | PRN

## 2024-08-23 MED ORDER — OXYCODONE-ACETAMINOPHEN 5-325 MG PO TABS
1.0000 | ORAL_TABLET | Freq: Once | ORAL | Status: AC
  Administered 2024-08-23: 1 via ORAL
  Filled 2024-08-23: qty 1

## 2024-08-23 MED ORDER — METRONIDAZOLE 0.75 % VA GEL: 1.0000 | Freq: Two times a day (BID) | VAGINAL | 0 refills | Status: AC

## 2024-08-23 NOTE — ED Provider Notes (Incomplete)
 " [place] EMERGENCY DEPARTMENT AT [facility] Provider Note   CSN: [id number] Arrival date & time: 08/22/24  2149     Patient presents with: Abdominal Pain   Kaylee Kelly is a 39 y.o. female.  {Add pertinent medical, surgical, social history, OB history to HPI:32947}  Abdominal Pain      Prior to Admission medications  Medication Sig Start Date End Date Taking? Authorizing Provider  acetaminophen  (TYLENOL ) 500 MG tablet Take 2 tablets (1,000 mg total) by mouth every 6 (six) hours as needed. 07/06/22   Neldon Hamp RAMAN, PA  amoxicillin -clavulanate (AUGMENTIN ) 875-125 MG tablet Take 1 tablet by mouth every 12 (twelve) hours. 07/06/22   Neldon Hamp RAMAN, PA  benzonatate  (TESSALON ) 100 MG capsule Take 1 capsule (100 mg total) by mouth every 8 (eight) hours. 05/28/24   Logan Ubaldo NOVAK, PA-C  guaiFENesin  (MUCINEX ) 600 MG 12 hr tablet Take 1 tablet (600 mg total) by mouth every 12 (twelve) hours as needed. 11/21/20   Couture, Cortni S, PA-C  metroNIDAZOLE  (FLAGYL ) 500 MG tablet Take 1 tablet (500 mg total) by mouth 2 (two) times daily. Patient not taking: Reported on 11/21/2020 10/30/20   Ervin, Michael L, MD  mometasone  (NASONEX ) 50 MCG/ACT nasal spray Place 2 sprays into the nose daily. 11/22/21   Nivia Colon, PA-C  neomycin-bacitracin-polymyxin [phone] OINT Apply 1 Application topically 3 (three) times daily. 07/06/22   Neldon Hamp RAMAN, PA  olopatadine  (PATANOL) 0.1 % ophthalmic solution Place 1 drop into both eyes 2 (two) times daily. 11/22/21   Nivia Colon, PA-C  ondansetron  (ZOFRAN ) 4 MG tablet Take 1 tablet (4 mg total) by mouth every 6 (six) hours. 11/21/20   Couture, Cortni S, PA-C  ondansetron  (ZOFRAN -ODT) 4 MG disintegrating tablet 4mg  ODT q4 hours prn nausea/vomit 08/09/23   Floyd, Dan, DO  predniSONE  (DELTASONE ) 50 MG tablet Take one tablet by mouth daily starting tomorrow 09/27/22 for 4 days 09/26/22   Poggi, Jenna E, PA-C    Allergies: Patient has no known allergies.     Review of Systems  Gastrointestinal:  Positive for abdominal pain.    Updated Vital Signs BP 115/81 (BP Location: Right Arm)   Pulse 97   Temp 98.1 F (36.7 C) (Oral)   Resp 17   Ht 5' 4 (1.626 m)   Wt 59 kg   SpO2 98%   BMI 22.31 kg/m   Physical Exam  (all labs ordered are listed, but only abnormal results are displayed) Labs Reviewed  WET PREP, GENITAL - Abnormal; Notable for the following components:      Result Value   Clue Cells Wet Prep HPF POC PRESENT (*)    WBC, Wet Prep HPF POC >=10 (*)    All other components within normal limits  COMPREHENSIVE METABOLIC PANEL WITH GFR - Abnormal; Notable for the following components:   Potassium 3.4 (*)    Glucose, Bld 138 (*)    All other components within normal limits  URINALYSIS, ROUTINE W REFLEX MICROSCOPIC - Abnormal; Notable for the following components:   Leukocytes,Ua SMALL (*)    All other components within normal limits  URINALYSIS, MICROSCOPIC (REFLEX) - Abnormal; Notable for the following components:   Bacteria, UA FEW (*)    All other components within normal limits  LIPASE, BLOOD  CBC  HCG, SERUM, QUALITATIVE  GC/CHLAMYDIA PROBE AMP ([place]) NOT AT [facility]    EKG: None  Radiology: US  PELVIC COMPLETE W TRANSVAGINAL AND TORSION R/O Result Date: 08/23/2024 EXAM: US   Pelvis, Complete Transvaginal and Transabdominal without Doppler TECHNIQUE: Transabdominal and transvaginal pelvic duplex ultrasound using B-mode/gray scaled imaging without Doppler spectral analysis and color flow was obtained. COMPARISON: None provided CLINICAL HISTORY: Pelvic pain. FINDINGS: UTERUS: Uterus measures 9.0 x 3.7 x 6.2 cm. Volume 110 ml. Uterus demonstrates normal myometrial echotexture. IUD in the endometrial cavity and expected position. ENDOMETRIAL STRIPE: Endometrium measures 4 mm with trace fluid in the endometrial cavity. Endometrial stripe is within normal limits. RIGHT OVARY: Right ovary measures 2.4 x 1.6 x 1.8 cm. Volume  3.6 ml. Right ovary is within normal limits. LEFT OVARY: Left ovary measures 2.1 x 1.6 x 1.6 cm. Volume 2.9 ml. Limited visualization of the left ovary due to bowel gas. Left ovary is within normal limits. FREE FLUID: Trace free fluid in the pelvis. IMPRESSION: 1. No acute findings. 2. IUD in expected position within the endometrial cavity. Electronically signed by: Norman Gatlin MD 08/23/2024 01:54 AM EST RP Workstation: [id number]    {Document cardiac monitor, telemetry assessment procedure when appropriate:32947} Procedures   Medications Ordered in the ED  oxyCODONE -acetaminophen  (PERCOCET/ROXICET) 5-325 MG per tablet 1 tablet (1 tablet Oral Given 08/23/24 0107)    Clinical Course as of 08/23/24 0241  Tue Aug 23, 2024  0235 Patient to ED with symptoms that started 2-3 months ago and include abdominal bloating, R>L pelvic pain, dyspareunia and vaginal odor without noted discharge. No nausea, fever. She reports urinating frequently but producing small amounts.   Pelvic exam concerning for CMT and right adnexal tenderness. Labs +BV, >10 WBC on wet prep. GC/chlamydia cultures pending. No leukocytosis, normal HGB, normal renal function, no electrolyte abnormalities. Pelvic US  performed and is negative for any acute findings.   Shared decision making with the patient. CT abd/pel offered for further evaluation, but patient declined. As symptoms are 2-3 month duration, no fever, no WBC count, discussed that outpatient follow up would be appropriate and highly recommended to continue efforts to diagnose her symptoms. She will receive treatment for BV. She declined RPR and HIV testing. She declined empiric treatment for GC/chlamydia until cultures resulted.   Will refer to OB/GYN to discuss IUD that has now been in for 9 years. Discussed strict return precautions that should prompt return to the ED.  [SU]    Clinical Course User Index [SU] Odell Balls, PA-C   {Click here for ABCD2, HEART and other  calculators REFRESH Note before signing:1}                              Medical Decision Making Amount and/or Complexity of Data Reviewed Labs: ordered. Radiology: ordered.  Risk Prescription drug management.   ***  {Document critical care time when appropriate  Document review of labs and clinical decision tools ie CHADS2VASC2, etc  Document your independent review of radiology images and any outside records  Document your discussion with family members, caretakers and with consultants  Document social determinants of health affecting pt's care  Document your decision making why or why not admission, treatments were needed:32947:::1}   Final diagnoses:  None    ED Discharge Orders     None        "

## 2024-08-23 NOTE — Discharge Instructions (Signed)
 As we discussed, follow up in the outpatient setting is essential to continue to evaluate your symptoms. You are being treated for bacterial vaginosis with Metrogel . You have declined empiric testing or treatment for STD's. Please check MyChart in 2 days for culture results and seek treatment if positive. You have been referred to [facility] - please call for an appointment.   Return to the ED with any new or worsening symptoms at any time.
